# Patient Record
Sex: Female | Born: 1940 | Race: Black or African American | Hispanic: No | Smoking: Never smoker
Health system: Southern US, Community
[De-identification: ages and names within clinical notes are randomized; demographics above are authoritative.]

## PROBLEM LIST (undated history)

## (undated) DIAGNOSIS — E785 Hyperlipidemia, unspecified: Secondary | ICD-10-CM

## (undated) DIAGNOSIS — J302 Other seasonal allergic rhinitis: Secondary | ICD-10-CM

## (undated) DIAGNOSIS — I10 Essential (primary) hypertension: Secondary | ICD-10-CM

## (undated) DIAGNOSIS — M51369 Other intervertebral disc degeneration, lumbar region without mention of lumbar back pain or lower extremity pain: Secondary | ICD-10-CM

## (undated) DIAGNOSIS — E039 Hypothyroidism, unspecified: Secondary | ICD-10-CM

## (undated) DIAGNOSIS — L309 Dermatitis, unspecified: Secondary | ICD-10-CM

## (undated) DIAGNOSIS — T148XXA Other injury of unspecified body region, initial encounter: Secondary | ICD-10-CM

## (undated) DIAGNOSIS — H269 Unspecified cataract: Secondary | ICD-10-CM

## (undated) DIAGNOSIS — R42 Dizziness and giddiness: Secondary | ICD-10-CM

## (undated) DIAGNOSIS — M5136 Other intervertebral disc degeneration, lumbar region: Secondary | ICD-10-CM

## (undated) DIAGNOSIS — M8580 Other specified disorders of bone density and structure, unspecified site: Secondary | ICD-10-CM

## (undated) DIAGNOSIS — E042 Nontoxic multinodular goiter: Secondary | ICD-10-CM

## (undated) DIAGNOSIS — H409 Unspecified glaucoma: Secondary | ICD-10-CM

## (undated) HISTORY — PX: ABDOMINAL HYSTERECTOMY: SHX81

## (undated) HISTORY — PX: HAND SURGERY: SHX662

## (undated) HISTORY — PX: BREAST BIOPSY: SHX20

## (undated) HISTORY — PX: ANKLE SURGERY: SHX546

## (undated) HISTORY — PX: COLONOSCOPY: SHX174

## (undated) HISTORY — PX: OTHER SURGICAL HISTORY: SHX169

## (undated) HISTORY — PX: SHOULDER SURGERY: SHX246

---

## 2001-09-11 ENCOUNTER — Encounter: Admission: RE | Admit: 2001-09-11 | Discharge: 2001-09-11 | Payer: Self-pay | Admitting: Pediatrics

## 2001-09-23 ENCOUNTER — Encounter: Payer: Self-pay | Admitting: Orthopedic Surgery

## 2001-09-23 ENCOUNTER — Encounter: Admission: RE | Admit: 2001-09-23 | Discharge: 2001-09-23 | Payer: Self-pay | Admitting: Orthopedic Surgery

## 2001-11-28 ENCOUNTER — Ambulatory Visit (HOSPITAL_BASED_OUTPATIENT_CLINIC_OR_DEPARTMENT_OTHER): Admission: RE | Admit: 2001-11-28 | Discharge: 2001-11-28 | Payer: Self-pay | Admitting: Orthopedic Surgery

## 2004-07-02 ENCOUNTER — Ambulatory Visit (HOSPITAL_BASED_OUTPATIENT_CLINIC_OR_DEPARTMENT_OTHER): Admission: RE | Admit: 2004-07-02 | Discharge: 2004-07-02 | Payer: Self-pay | Admitting: Orthopedic Surgery

## 2004-07-02 ENCOUNTER — Ambulatory Visit (HOSPITAL_COMMUNITY): Admission: RE | Admit: 2004-07-02 | Discharge: 2004-07-02 | Payer: Self-pay | Admitting: Orthopedic Surgery

## 2006-01-05 ENCOUNTER — Ambulatory Visit (HOSPITAL_COMMUNITY): Admission: RE | Admit: 2006-01-05 | Discharge: 2006-01-05 | Payer: Self-pay | Admitting: Orthopedic Surgery

## 2006-01-05 ENCOUNTER — Encounter: Payer: Self-pay | Admitting: Vascular Surgery

## 2007-04-17 ENCOUNTER — Encounter: Admission: RE | Admit: 2007-04-17 | Discharge: 2007-04-17 | Payer: Self-pay | Admitting: Orthopedic Surgery

## 2010-05-11 ENCOUNTER — Encounter: Payer: Self-pay | Admitting: Orthopedic Surgery

## 2010-09-05 NOTE — Op Note (Signed)
NAMELAIKLYNN, RACZYNSKI              ACCOUNT NO.:  [id number]   MEDICAL RECORD NO.:  [id number]          PATIENT TYPE:  AMB   LOCATION:  DSC                          FACILITY:  [facility]   PHYSICIAN:  Cindee Salt, M.D.       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   OPERATION:  Decompression of radial and median nerves, right arm.   SURGEON:  Cindee Salt, M.D.   ASSISTANTCarolyne Fiscal.   ANESTHESIA:  General.   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   PROCEDURE:  The patient was brought to the operating room where a general  anesthetic was carried out without difficulty.  She was prepped and draped  using DuraPrep, supine position, right arm free.  The limb was exsanguinated  with an Esmarch bandage.  Tourniquet placed on the arm was inflated to 250  mmHg.  A straight incision was made longitudinally in the palm and down  through subcutaneous tissue.  Bleeders were cauterized.  Palmar fascia was  split, superficial palmar arch identified and the flexor tendon to the ring  and  little finger identified.  To the ulnar side of the median nerve, the  carpal retinaculum was incised with sharp dissection.  Right-angle and  Sewell retractors were placed between skin and forearm fascia.  The fascia  was released for approximately a centimeter and a half proximal to the wrist  crease under direct vision.  Canal was explored, tenosynovial tissue  moderately thickened, the area of deformity to the nerve apparent.  The  wound was irrigated and skin closed with  interrupted 5-0 nylon sutures.  A  separate incision was then made the antecubital fossa, carried out  proximally onto the distal upper arm, carried down through subcutaneous  tissue, the dissection carried down and the lateral antebrachial cutaneous  nerve of the forearm identified and protected, the dissection carried down  to the mobile wad of Sherilyn Cooter; this was elevated.  The radial nerve was  identified proximally exiting along the course of the brachialis and  brachioradialis.  The dissection was then carried distally.  Moderate and  tissue was present around this area; this was entirely debrided off.  The  nerve was found to be quite irritable.  The posterior interosseous nerve was  then identified; this was noted be compressed by a band of tissue in the  supinator at the arcade of [place]; this was released.  A retractor was  placed to identify the posterior interosseous and this was followed through  the supinator with no further compression.  No further lesions were  identified along the course of the radial nerve or the posterior  interosseous.  The  wound was irrigated and the subcutaneous tissue was  closed interrupted 4-0 Vicryl and skin with a subcuticular 4-0 Monocryl  suture.  Steri-Strips were applied and a sterile compressive long arm splint  applied.  The patient tolerated the procedure well and was taken to the recovery room  for observation in satisfactory condition.   She is discharged home to return to the [facility] in 1 week  on Vicodin.      GK/MEDQ  D:  07/02/2004  T:  07/02/2004  Job:  [id number]

## 2010-09-05 NOTE — Op Note (Signed)
Victoria Lyons, Victoria Lyons                          ACCOUNT NO.:  [id number]   MEDICAL RECORD NO.:  [id number]                    PATIENT TYPE:   LOCATION:                                       FACILITY:   PHYSICIAN:  Elana Alm. Thurston Hole, M.D.              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   POSTOPERATIVE DIAGNOSIS:  Left shoulder adhesive capsulitis with rotator  cuff tendonitis and impingement.   PROCEDURE:  1. Left shoulder EUA followed by manipulation.  2. Left shoulder arthroscopic lysis of adhesions with subacromial     decompression.   SURGEON:  Elana Alm. Thurston Hole, M.D.   ASSISTANT:  __________  Bethann Goo , [place].   ANESTHESIA:  General.   OPERATIVE TIME:  40 minutes.   COMPLICATIONS:  None.   INDICATIONS FOR PROCEDURE:  The patient is a 70 year old woman who has had  almost a year of left shoulder pain increasing in nature with signs and  symptoms and MRI documenting rotator cuff tendonitis with impingement in  adhesive capsulitis.  She has failed conservative care and is now to undergo  arthroscopy and manipulation.   DESCRIPTION:  The patient was brought to the operating room on 11/28/2001,  placed on the operative table in supine position.  After an adequate level  of general anesthesia was obtained, her left shoulder was examined under  anesthesia.  She had initial limitations of motion of forward flexion of  120, abduction of 100, internal rotation of 50, external rotation of 50  degrees.  A gentle manipulation was then carried out breaking up soft  adhesions and improving forward flexion to 175, abduction to 175, internal  and external rotation of 85% degrees.  The shoulder remained stable  ligamentous exam.  After this was done, she was placed on a beach-chair  position and her  shoulder and arm was prepped and draped using sterile  technique.  Originally the arthroscopy was performed  through a posterior  arthroscopic pull.  The arthroscope with a pump attached was placed into an  anterior portal and arthroscopic probe was placed.  On initial inspection  the articular cartilage in the glenohumeral joint was intact.  Anterior and  posterior labium intact.  Inferior labium and anterior inferior glenohumeral  ligament complex was intact.  Superior labium and biceps tendon and ankle  was intact.  The biceps tendon was intact.  The rotator cuff was thoroughly  inspected.  There was found to be no evidence of a tear.  Inferior capsule  recess showed mild hemorrhagic synovitis secondary to the adhesive  capsulitis and manipulation but no other pathology noted.  Subacromial space  was entered and a lateral arthroscopic portal was made.  Moderately thicken  bursitis was resected.  Subacromial decompression was carried out removing 6-  8 mm of the under surface of the anterolateral and anteromedial acromion and  CA ligament release carried out as well.  [facility] joint was not disturbed.  After this was done, the shoulder could be brought to a full range of motion  with no impingement on the rotator cuff which showed slight inflammation but  no evidence of tearing.  At this point, it was felt that all pathology had  been satisfactorily addressed.  The instruments were removed.  Portal was  closed with 3-0 nylon suture and injected with 0.25% Marcaine with  epinephrine.  Sterile dressings and a sling applied and the patient awakened  and taken to the recovery room in stable condition.   FOLLOW-UP CARE:  The patient will be followed as an outpatient on Darvocet  and Aleve.  Early, aggressive physical therapy.  See her back in the office  in a week for sutures out and followup.                                                Robert A. Thurston Hole, M.D.    RAW/MEDQ  D:   11/28/2001  T:  12/01/2001  Job:  [id number]

## 2015-02-14 ENCOUNTER — Other Ambulatory Visit: Payer: Self-pay | Admitting: Orthopedic Surgery

## 2015-02-14 DIAGNOSIS — M25512 Pain in left shoulder: Secondary | ICD-10-CM

## 2015-03-04 ENCOUNTER — Ambulatory Visit
Admission: RE | Admit: 2015-03-04 | Discharge: 2015-03-04 | Disposition: A | Discharge status: Home / Self Care | Payer: BC Managed Care – PPO | Source: Ambulatory Visit | Attending: Orthopedic Surgery | Admitting: Orthopedic Surgery

## 2015-03-04 DIAGNOSIS — M25512 Pain in left shoulder: Secondary | ICD-10-CM

## 2016-05-28 ENCOUNTER — Other Ambulatory Visit: Payer: Self-pay | Admitting: Physical Medicine and Rehabilitation

## 2016-05-28 DIAGNOSIS — M25551 Pain in right hip: Secondary | ICD-10-CM

## 2016-06-09 ENCOUNTER — Ambulatory Visit
Admission: RE | Admit: 2016-06-09 | Discharge: 2016-06-09 | Disposition: A | Discharge status: Home / Self Care | Payer: BC Managed Care – PPO | Source: Ambulatory Visit | Attending: Physical Medicine and Rehabilitation | Admitting: Physical Medicine and Rehabilitation

## 2016-06-09 DIAGNOSIS — M25551 Pain in right hip: Secondary | ICD-10-CM

## 2016-06-09 MED ORDER — METHYLPREDNISOLONE ACETATE 40 MG/ML INJ SUSP (RADIOLOG
120.0000 mg | Freq: Once | INTRAMUSCULAR | Status: AC
  Administered 2016-06-09: 120 mg via INTRA_ARTICULAR

## 2016-06-09 MED ORDER — IOPAMIDOL (ISOVUE-M 200) INJECTION 41%
15.0000 mL | Freq: Once | INTRAMUSCULAR | Status: AC
  Administered 2016-06-09: 15 mL via INTRA_ARTICULAR

## 2017-08-01 ENCOUNTER — Other Ambulatory Visit: Payer: Self-pay

## 2017-08-01 ENCOUNTER — Other Ambulatory Visit (HOSPITAL_BASED_OUTPATIENT_CLINIC_OR_DEPARTMENT_OTHER): Payer: Self-pay

## 2017-08-10 ENCOUNTER — Other Ambulatory Visit: Payer: Self-pay

## 2017-08-10 ENCOUNTER — Emergency Department (HOSPITAL_BASED_OUTPATIENT_CLINIC_OR_DEPARTMENT_OTHER)
Admission: EM | Admit: 2017-08-10 | Discharge: 2017-08-10 | Disposition: A | Discharge status: Home / Self Care | Payer: Medicare Other | Attending: Emergency Medicine | Admitting: Emergency Medicine

## 2017-08-10 ENCOUNTER — Encounter (HOSPITAL_BASED_OUTPATIENT_CLINIC_OR_DEPARTMENT_OTHER): Payer: Self-pay

## 2017-08-10 ENCOUNTER — Emergency Department (HOSPITAL_BASED_OUTPATIENT_CLINIC_OR_DEPARTMENT_OTHER): Payer: Medicare Other

## 2017-08-10 DIAGNOSIS — R000 Tachycardia, unspecified: Secondary | ICD-10-CM | POA: Insufficient documentation

## 2017-08-10 DIAGNOSIS — I10 Essential (primary) hypertension: Secondary | ICD-10-CM | POA: Insufficient documentation

## 2017-08-10 DIAGNOSIS — N3000 Acute cystitis without hematuria: Secondary | ICD-10-CM | POA: Diagnosis not present

## 2017-08-10 DIAGNOSIS — R509 Fever, unspecified: Secondary | ICD-10-CM

## 2017-08-10 HISTORY — DX: Essential (primary) hypertension: I10

## 2017-08-10 LAB — URINALYSIS, ROUTINE W REFLEX MICROSCOPIC
Bilirubin Urine: NEGATIVE
Glucose, UA: NEGATIVE mg/dL
KETONES UR: NEGATIVE mg/dL
Nitrite: POSITIVE — AB
PROTEIN: NEGATIVE mg/dL
Specific Gravity, Urine: 1.015 (ref 1.005–1.030)
pH: 6 (ref 5.0–8.0)

## 2017-08-10 LAB — HEPATIC FUNCTION PANEL
ALT: 14 U/L (ref 14–54)
AST: 19 U/L (ref 15–41)
Albumin: 4.1 g/dL (ref 3.5–5.0)
Alkaline Phosphatase: 85 U/L (ref 38–126)
BILIRUBIN INDIRECT: 0.4 mg/dL (ref 0.3–0.9)
Bilirubin, Direct: 0.1 mg/dL (ref 0.1–0.5)
TOTAL PROTEIN: 7.6 g/dL (ref 6.5–8.1)
Total Bilirubin: 0.5 mg/dL (ref 0.3–1.2)

## 2017-08-10 LAB — BASIC METABOLIC PANEL
Anion gap: 9 (ref 5–15)
BUN: 11 mg/dL (ref 6–20)
CALCIUM: 8.8 mg/dL — AB (ref 8.9–10.3)
CO2: 24 mmol/L (ref 22–32)
CREATININE: 0.89 mg/dL (ref 0.44–1.00)
Chloride: 105 mmol/L (ref 101–111)
GFR calc Af Amer: >60 mL/min (ref >60)
GFR calc non Af Amer: >60 mL/min (ref >60)
Glucose, Bld: 115 mg/dL — ABNORMAL HIGH (ref 65–99)
Potassium: 3.2 mmol/L — ABNORMAL LOW (ref 3.5–5.1)
SODIUM: 138 mmol/L (ref 135–145)

## 2017-08-10 LAB — CBC
HCT: 38.9 % (ref 36.0–46.0)
Hemoglobin: 13.3 g/dL (ref 12.0–15.0)
MCH: 27.8 pg (ref 26.0–34.0)
MCHC: 34.2 g/dL (ref 30.0–36.0)
MCV: 81.2 fL (ref 78.0–100.0)
Platelets: 278 10*3/uL (ref 150–400)
RBC: 4.79 MIL/uL (ref 3.87–5.11)
RDW: 15.4 % (ref 11.5–15.5)
WBC: 11 10*3/uL — AB (ref 4.0–10.5)

## 2017-08-10 LAB — I-STAT CG4 LACTIC ACID, ED
Lactic Acid, Venous: 1.07 mmol/L (ref 0.5–1.9)
Lactic Acid, Venous: 0.99 mmol/L (ref 0.5–1.9)

## 2017-08-10 LAB — DIFFERENTIAL
BASOS ABS: 0 10*3/uL (ref 0.0–0.1)
BASOS PCT: 0 %
Eosinophils Absolute: 0.1 10*3/uL (ref 0.0–0.7)
Eosinophils Relative: 1 %
Lymphocytes Relative: 30 %
Lymphs Abs: 3.3 10*3/uL (ref 0.7–4.0)
Monocytes Absolute: 1 10*3/uL (ref 0.1–1.0)
Monocytes Relative: 9 %
NEUTROS ABS: 6.6 10*3/uL (ref 1.7–7.7)
Neutrophils Relative %: 60 %

## 2017-08-10 LAB — URINALYSIS, MICROSCOPIC (REFLEX)

## 2017-08-10 LAB — TROPONIN I

## 2017-08-10 MED ORDER — CEPHALEXIN 500 MG PO CAPS: 500.0000 mg | ORAL_CAPSULE | Freq: Three times a day (TID) | ORAL | 0 refills | Status: DC

## 2017-08-10 MED ORDER — FLUCONAZOLE 150 MG PO TABS: 150.0000 mg | ORAL_TABLET | Freq: Once | ORAL | 0 refills | Status: AC

## 2017-08-10 MED ORDER — SODIUM CHLORIDE 0.9 % IV BOLUS
1000.0000 mL | Freq: Once | INTRAVENOUS | Status: AC
  Administered 2017-08-10: 1000 mL via INTRAVENOUS

## 2017-08-10 MED ORDER — ACETAMINOPHEN 500 MG PO TABS
1000.0000 mg | ORAL_TABLET | Freq: Once | ORAL | Status: AC
  Administered 2017-08-10: 1000 mg via ORAL
  Filled 2017-08-10: qty 2

## 2017-08-10 MED ORDER — SODIUM CHLORIDE 0.9 % IV SOLN
1.0000 g | Freq: Once | INTRAVENOUS | Status: AC
  Administered 2017-08-10: 1 g via INTRAVENOUS
  Filled 2017-08-10: qty 10

## 2017-08-10 NOTE — ED Notes (Signed)
Lactic results given to Dr Elesa MassedWard.

## 2017-08-10 NOTE — ED Provider Notes (Signed)
TIME SEEN: 2:23 AM  CHIEF COMPLAINT: Tachycardia  HPI: Patient is a 77 year old female with history of hypertension who presents to the emergency department with tachycardia.  She states tonight she felt like her heart was going fast and she checked her vitals at home and her blood pressure was in the 150s/90s and her heart rate was 111.  She states that she has not had any chest pain or shortness of breath.  She is otherwise been feeling well.  No known fevers.  No vomiting or diarrhea.  No dysuria or hematuria.  No cough.  ROS: See HPI Constitutional: no fever  Eyes: no drainage  ENT: no runny nose   Cardiovascular:  no chest pain  Resp: no SOB  GI: no vomiting GU: no dysuria Integumentary: no rash  Allergy: no hives  Musculoskeletal: no leg swelling  Neurological: no slurred speech ROS otherwise negative  PAST MEDICAL HISTORY/PAST SURGICAL HISTORY:  Past Medical History:  Diagnosis Date  . Hypertension     MEDICATIONS:  Prior to Admission medications   Not on File    ALLERGIES:  Allergies  Allergen Reactions  . Tetanus Toxoids Rash  . Tetracyclines & Related Rash    SOCIAL HISTORY:  Social History   Tobacco Use  . Smoking status: Never Smoker  . Smokeless tobacco: Never Used  Substance Use Topics  . Alcohol use: Never    Frequency: Never    FAMILY HISTORY: History reviewed. No pertinent family history.  EXAM: BP (!) 165/86 (BP Location: Left Arm)   Pulse (!) 122   Temp 100 F (37.8 C) (Oral)   Resp 16   Ht 5\' 6"  (1.676 m)   Wt 81.4 kg (179 lb 7.3 oz)   SpO2 98%   BMI 28.96 kg/m  CONSTITUTIONAL: Alert and oriented and responds appropriately to questions. Well-appearing; well-nourished HEAD: Normocephalic EYES: Conjunctivae clear, pupils appear equal, EOMI ENT: normal nose; moist mucous membranes NECK: Supple, no meningismus, no nuchal rigidity, no LAD  CARD: RRR; S1 and S2 appreciated; no murmurs, no clicks, no rubs, no gallops RESP: Normal chest  excursion without splinting or tachypnea; breath sounds clear and equal bilaterally; no wheezes, no rhonchi, no rales, no hypoxia or respiratory distress, speaking full sentences ABD/GI: Normal bowel sounds; non-distended; soft, non-tender, no rebound, no guarding, no peritoneal signs, no hepatosplenomegaly BACK:  The back appears normal and is non-tender to palpation, there is no CVA tenderness EXT: Normal ROM in all joints; non-tender to palpation; no edema; normal capillary refill; no cyanosis, no calf tenderness or swelling    SKIN: Normal color for age and race; warm; no rash NEURO: Moves all extremities equally PSYCH: The patient's mood and manner are appropriate. Grooming and personal hygiene are appropriate.  MEDICAL DECISION MAKING: Patient here with tachycardia.  Likely because she has a low-grade temperature of 100.  She is otherwise extremely well-appearing without complaints.  Heart rate has improved into the 80s after Tylenol.  She does not appear septic.  Normal blood pressure normal lactate.  She does have a leukocytosis of 11,000.  Chest x-ray is clear.  Troponin is negative.  Electrolytes normal.  She does appear to have a urinary tract infection which is likely the cause of her symptoms today.  Given she is so well-appearing I feel it is reasonable to let her go home and patient and son are comfortable with this plan.  Given dose of IV ceftriaxone and IV fluids here in the emergency department.  She will follow-up with  her primary care physician in 1 week for recheck.  Will discharge with Keflex.  Also given prescription for Diflucan as she is concerned she may develop yeast infection after antibiotics.  Discussed at length return precautions.  Patient and family comfortable with this plan.  Doubt pneumonia.  Doubt meningitis.  Doubt bacteremia.  Doubt ACS, PE or dissection.  At this time, I do not feel there is any life-threatening condition present. I have reviewed and discussed all  results (EKG, imaging, lab, urine as appropriate) and exam findings with patient/family. I have reviewed nursing notes and appropriate previous records.  I feel the patient is safe to be discharged home without further emergent workup and can continue workup as an outpatient as needed. Discussed usual and customary return precautions. Patient/family verbalize understanding and are comfortable with this plan.  Outpatient follow-up has been provided if needed. All questions have been answered.     EKG Interpretation  Date/Time:  Tuesday August 10 2017 02:02:05 EDT Ventricular Rate:  97 PR Interval:    QRS Duration: 72 QT Interval:  425 QTC Calculation: 540 R Axis:   -58 Text Interpretation:  Sinus rhythm Probable left atrial enlargement Inferior infarct, old Consider anterior infarct Prolonged QT interval Confirmed by Rochele Raring [phone]) on 08/10/2017 2:24:23 AM          Ward, Layla Maw, DO 08/10/17 [id number]

## 2017-08-10 NOTE — ED Triage Notes (Signed)
Pt reports feeling like her heart is racing. Pts HR in triage 120s. Pt denies cardiac hx. Pt denies taking any stimulants this PM. Pt denies cp and sob. Pt A+OX4, speaking in complete sentences.

## 2017-08-10 NOTE — Discharge Instructions (Signed)
You may take Tylenol 1000 mg every 6 hours as needed for fever and pain.  Please follow-up with your primary care physician in 1 week for recheck.

## 2017-08-10 NOTE — ED Notes (Signed)
Lactic results given to Dr Ward. 

## 2017-08-10 NOTE — ED Notes (Signed)
Patient transported to X-ray 

## 2017-08-12 LAB — URINE CULTURE: Culture: >=100000 — AB

## 2017-08-13 ENCOUNTER — Telehealth: Payer: Self-pay

## 2017-08-13 NOTE — Telephone Encounter (Signed)
Post ED Visit - Positive Culture Follow-up  Culture report reviewed by antimicrobial stewardship pharmacist:  []  Enzo BiNathan Batchelder, Pharm.D. []  Celedonio MiyamotoJeremy Frens, Pharm.D., BCPS AQ-ID []  Garvin FilaMike Maccia, Pharm.D., BCPS [x]  Georgina PillionElizabeth Martin, Pharm.D., BCPS []  LakesideMinh Pham, Vermont[place]arm.D., BCPS, AAHIVP []  Estella HuskMichelle Turner, Pharm.D., BCPS, AAHIVP []  Lysle Pearlachel Rumbarger, PharmD, BCPS []  Blake DivineShannon Parkey, PharmD []  Pollyann SamplesAndy Johnston, PharmD, BCPS  Positive urine culture Treated with Cephalexin, organism sensitive to the same and no further patient follow-up is required at this time.  Jerry CarasCullom, Jadelin Eng Burnett 08/13/2017, 9:24 AM

## 2019-09-15 ENCOUNTER — Encounter (HOSPITAL_COMMUNITY): Payer: Self-pay

## 2019-09-15 NOTE — Patient Instructions (Addendum)
DUE TO COVID-19 ONLY ONE VISITOR ARE ALLOWED TO COME WITH YOU AND STAY IN THE WAITING ROOM ONLY DURING PRE OP AND PROCEDURE. THEN TWO VISITORS MAY VISIT WITH YOU IN YOUR PRIVATE ROOM DURING VISITING HOURS ONLY!!   COVID SWAB TESTING MUST BE COMPLETED ON:  Friday , September 22, 2019  11:15AM  [address], [place] -[facility] enter pre surgical testing line (Must self quarantine after testing. Follow instructions on handout.)             Your procedure is scheduled on: Tuesday, September 26, 2019   Report to [facility] Main  Entrance    Report to admitting at 8:15 AM   Call this number if you have problems the morning of surgery [phone]   Do not eat food :After Midnight.   May have liquids until 7:45 AM day of surgery   CLEAR LIQUID DIET  Foods Allowed                                                                     Foods Excluded  Water, Black Coffee and tea, regular and decaf                             liquids that you cannot  Plain Jell-O in any flavor  (No red)                                           see through such as: Fruit ices (not with fruit pulp)                                     milk, soups, orange juice  Iced Popsicles (No red)                                    All solid food                                    Apple juices Sports drinks like Gatorade (No red) Lightly seasoned clear broth or consume(fat free) Sugar, honey syrup  Sample Menu Breakfast                                Lunch                                     Supper Cranberry juice                    Beef broth                            Chicken broth Jell-O  Grape juice                           Apple juice Coffee or tea                        Jell-O                                      Popsicle                                                Coffee or tea                        Coffee or tea   Complete one Ensure drink the  morning of surgery at 7:45 AM  the day of surgery.   Oral Hygiene is also important to reduce your risk of infection.                                    Remember - BRUSH YOUR TEETH THE MORNING OF SURGERY WITH YOUR REGULAR TOOTHPASTE   Do NOT smoke after Midnight   Take these medicines the morning of surgery with A SIP OF WATER: Amlodipine, Loratadine if needed   May use Flonase and eye drops if needed day of surgery                               You may not have any metal on your body including hair pins, jewelry, and body piercings             Do not wear make-up, lotions, powders, perfumes/cologne, or deodorant             Do not wear nail polish.  Do not shave  48 hours prior to surgery.               Do not bring valuables to the hospital. [facility] IS NOT             RESPONSIBLE   FOR VALUABLES.   Contacts, dentures or bridgework may not be worn into surgery.   Bring small overnight bag day of surgery.    Patients discharged the day of surgery will not be allowed to drive home.   Special Instructions: Bring a copy of your healthcare power of attorney and living will documents         the day of surgery if you haven't scanned them in before.              Please read over the following fact sheets you were given: IF YOU HAVE QUESTIONS ABOUT YOUR PRE OP INSTRUCTIONS PLEASE CALL [phone]   [facility] - Preparing for Surgery Before surgery, you can play an important role.  Because skin is not sterile, your skin needs to be as free of germs as possible.  You can reduce the number of germs on your skin by washing with CHG (chlorahexidine gluconate) soap before surgery.  CHG is an antiseptic cleaner which kills germs and bonds with the skin  to continue killing germs even after washing. Please DO NOT use if you have an allergy to CHG or antibacterial soaps.  If your skin becomes reddened/irritated stop using the CHG and inform your nurse when you arrive at Short Stay. Do not shave  (including legs and underarms) for at least 48 hours prior to the first CHG shower.  You may shave your face/neck.  Please follow these instructions carefully:  1.  Shower with CHG Soap the night before surgery and the  morning of surgery.  2.  If you choose to wash your hair, wash your hair first as usual with your normal  shampoo.  3.  After you shampoo, rinse your hair and body thoroughly to remove the shampoo.                             4.  Use CHG as you would any other liquid soap.  You can apply chg directly to the skin and wash.  Gently with a scrungie or clean washcloth.  5.  Apply the CHG Soap to your body ONLY FROM THE NECK DOWN.   Do   not use on face/ open                           Wound or open sores. Avoid contact with eyes, ears mouth and   genitals (private parts).                       Wash face,  Genitals (private parts) with your normal soap.             6.  Wash thoroughly, paying special attention to the area where your    surgery  will be performed.  7.  Thoroughly rinse your body with warm water from the neck down.  8.  DO NOT shower/wash with your normal soap after using and rinsing off the CHG Soap.                9.  Pat yourself dry with a clean towel.            10.  Wear clean pajamas.            11.  Place clean sheets on your bed the night of your first shower and do not  sleep with pets. Day of Surgery : Do not apply any lotions/deodorants the morning of surgery.  Please wear clean clothes to the hospital/surgery center.  FAILURE TO FOLLOW THESE INSTRUCTIONS MAY RESULT IN THE CANCELLATION OF YOUR SURGERY  PATIENT SIGNATURE_________________________________  NURSE SIGNATURE__________________________________  ________________________________________________________________________   Rogelia Mire  An incentive spirometer is a tool that can help keep your lungs clear and active. This tool measures how well you are filling your lungs with each breath.  Taking long deep breaths may help reverse or decrease the chance of developing breathing (pulmonary) problems (especially infection) following:  A long period of time when you are unable to move or be active. BEFORE THE PROCEDURE   If the spirometer includes an indicator to show your best effort, your nurse or respiratory therapist will set it to a desired goal.  If possible, sit up straight or lean slightly forward. Try not to slouch.  Hold the incentive spirometer in an upright position. INSTRUCTIONS FOR USE  1. Sit on the edge of your bed if possible, or sit up as  far as you can in bed or on a chair. 2. Hold the incentive spirometer in an upright position. 3. Breathe out normally. 4. Place the mouthpiece in your mouth and seal your lips tightly around it. 5. Breathe in slowly and as deeply as possible, raising the piston or the ball toward the top of the column. 6. Hold your breath for 3-5 seconds or for as long as possible. Allow the piston or ball to fall to the bottom of the column. 7. Remove the mouthpiece from your mouth and breathe out normally. 8. Rest for a few seconds and repeat Steps 1 through 7 at least 10 times every 1-2 hours when you are awake. Take your time and take a few normal breaths between deep breaths. 9. The spirometer may include an indicator to show your best effort. Use the indicator as a goal to work toward during each repetition. 10. After each set of 10 deep breaths, practice coughing to be sure your lungs are clear. If you have an incision (the cut made at the time of surgery), support your incision when coughing by placing a pillow or rolled up towels firmly against it. Once you are able to get out of bed, walk around indoors and cough well. You may stop using the incentive spirometer when instructed by your caregiver.  RISKS AND COMPLICATIONS  Take your time so you do not get dizzy or light-headed.  If you are in pain, you may need to take or ask for pain  medication before doing incentive spirometry. It is harder to take a deep breath if you are having pain. AFTER USE  Rest and breathe slowly and easily.  It can be helpful to keep track of a log of your progress. Your caregiver can provide you with a simple table to help with this. If you are using the spirometer at home, follow these instructions: SEEK MEDICAL CARE IF:   You are having difficultly using the spirometer.  You have trouble using the spirometer as often as instructed.  Your pain medication is not giving enough relief while using the spirometer.  You develop fever of 100.5 F (38.1 C) or higher. SEEK IMMEDIATE MEDICAL CARE IF:   You cough up bloody sputum that had not been present before.  You develop fever of 102 F (38.9 C) or greater.  You develop worsening pain at or near the incision site. MAKE SURE YOU:   Understand these instructions.  Will watch your condition.  Will get help right away if you are not doing well or get worse. Document Released: 08/17/2006 Document Revised: 06/29/2011 Document Reviewed: 10/18/2006 [facility] Patient Information 2014 [place], [place].   ________________________________________________________________________

## 2019-09-19 ENCOUNTER — Encounter (HOSPITAL_COMMUNITY)
Admission: RE | Admit: 2019-09-19 | Discharge: 2019-09-19 | Disposition: A | Discharge status: Home / Self Care | Payer: Medicare PPO | Source: Ambulatory Visit | Attending: Orthopedic Surgery | Admitting: Orthopedic Surgery

## 2019-09-19 ENCOUNTER — Other Ambulatory Visit: Payer: Self-pay

## 2019-09-19 ENCOUNTER — Encounter (HOSPITAL_COMMUNITY): Payer: Self-pay

## 2019-09-19 DIAGNOSIS — I444 Left anterior fascicular block: Secondary | ICD-10-CM | POA: Diagnosis not present

## 2019-09-19 DIAGNOSIS — Z01818 Encounter for other preprocedural examination: Secondary | ICD-10-CM | POA: Insufficient documentation

## 2019-09-19 HISTORY — DX: Other injury of unspecified body region, initial encounter: T14.8XXA

## 2019-09-19 HISTORY — DX: Other intervertebral disc degeneration, lumbar region: M51.36

## 2019-09-19 HISTORY — DX: Dermatitis, unspecified: L30.9

## 2019-09-19 HISTORY — DX: Unspecified glaucoma: H40.9

## 2019-09-19 HISTORY — DX: Other intervertebral disc degeneration, lumbar region without mention of lumbar back pain or lower extremity pain: M51.369

## 2019-09-19 HISTORY — DX: Hypothyroidism, unspecified: E03.9

## 2019-09-19 HISTORY — DX: Hyperlipidemia, unspecified: E78.5

## 2019-09-19 HISTORY — DX: Nontoxic multinodular goiter: E04.2

## 2019-09-19 HISTORY — DX: Other specified disorders of bone density and structure, unspecified site: M85.80

## 2019-09-19 HISTORY — DX: Dizziness and giddiness: R42

## 2019-09-19 HISTORY — DX: Unspecified cataract: H26.9

## 2019-09-19 HISTORY — DX: Other seasonal allergic rhinitis: J30.2

## 2019-09-19 LAB — BASIC METABOLIC PANEL
Anion gap: 8 (ref 5–15)
BUN: 20 mg/dL (ref 8–23)
CO2: 26 mmol/L (ref 22–32)
Calcium: 9.3 mg/dL (ref 8.9–10.3)
Chloride: 109 mmol/L (ref 98–111)
Creatinine, Ser: 1.12 mg/dL — ABNORMAL HIGH (ref 0.44–1.00)
GFR calc Af Amer: 55 mL/min — ABNORMAL LOW (ref >60)
GFR calc non Af Amer: 47 mL/min — ABNORMAL LOW (ref >60)
Glucose, Bld: 103 mg/dL — ABNORMAL HIGH (ref 70–99)
Potassium: 3.8 mmol/L (ref 3.5–5.1)
Sodium: 143 mmol/L (ref 135–145)

## 2019-09-19 LAB — SURGICAL PCR SCREEN
MRSA, PCR: NEGATIVE
Staphylococcus aureus: NEGATIVE

## 2019-09-19 LAB — CBC
HCT: 43.5 % (ref 36.0–46.0)
Hemoglobin: 13.6 g/dL (ref 12.0–15.0)
MCH: 27.4 pg (ref 26.0–34.0)
MCHC: 31.3 g/dL (ref 30.0–36.0)
MCV: 87.5 fL (ref 80.0–100.0)
Platelets: 284 10*3/uL (ref 150–400)
RBC: 4.97 MIL/uL (ref 3.87–5.11)
RDW: 14.6 % (ref 11.5–15.5)
WBC: 5.8 10*3/uL (ref 4.0–10.5)
nRBC: 0 % (ref 0.0–0.2)

## 2019-09-19 NOTE — Progress Notes (Signed)
COVID Vaccine Completed: Yes Date COVID Vaccine completed: 08/08/19 COVID vaccine manufacturer: Moderna   PCP - M. Webb P.A. Cardiologist - N/A  Chest x-ray - greater than 1 year EKG - 6/1/20201 in epic Stress Test - N/A ECHO - N/A Cardiac Cath - N/A  Sleep Study - N/A CPAP - N/A  Fasting Blood Sugar - N/A Checks Blood Sugar __N/A___ times a day  Blood Thinner Instructions: N/A Aspirin Instructions: N/A Last Dose: N/A  Anesthesia review: N/A  Patient denies shortness of breath, fever, cough and chest pain at PAT appointment   Patient verbalized understanding of instructions that were given to them at the PAT appointment. Patient was also instructed that they will need to review over the PAT instructions again at home before surgery.

## 2019-09-22 ENCOUNTER — Other Ambulatory Visit (HOSPITAL_COMMUNITY)
Admission: RE | Admit: 2019-09-22 | Discharge: 2019-09-22 | Disposition: A | Discharge status: Home / Self Care | Payer: Medicare PPO | Source: Ambulatory Visit | Attending: Orthopedic Surgery | Admitting: Orthopedic Surgery

## 2019-09-22 DIAGNOSIS — Z20822 Contact with and (suspected) exposure to covid-19: Secondary | ICD-10-CM | POA: Diagnosis not present

## 2019-09-22 DIAGNOSIS — Z01812 Encounter for preprocedural laboratory examination: Secondary | ICD-10-CM | POA: Insufficient documentation

## 2019-09-23 LAB — SARS CORONAVIRUS 2 (TAT 6-24 HRS): SARS Coronavirus 2: NEGATIVE

## 2019-09-26 ENCOUNTER — Observation Stay (HOSPITAL_COMMUNITY): Payer: Medicare PPO

## 2019-09-26 ENCOUNTER — Encounter (HOSPITAL_COMMUNITY)
Admission: RE | Disposition: A | Discharge status: Home / Self Care | Payer: Self-pay | Source: Home / Self Care | Attending: Orthopedic Surgery

## 2019-09-26 ENCOUNTER — Encounter (HOSPITAL_COMMUNITY): Payer: Self-pay | Admitting: Orthopedic Surgery

## 2019-09-26 ENCOUNTER — Observation Stay (HOSPITAL_COMMUNITY)
Admission: RE | Admit: 2019-09-26 | Discharge: 2019-09-27 | Disposition: A | Discharge status: Home / Self Care | Payer: Medicare PPO | Attending: Orthopedic Surgery | Admitting: Orthopedic Surgery

## 2019-09-26 ENCOUNTER — Other Ambulatory Visit: Payer: Self-pay

## 2019-09-26 ENCOUNTER — Ambulatory Visit (HOSPITAL_COMMUNITY): Payer: Medicare PPO | Admitting: Anesthesiology

## 2019-09-26 DIAGNOSIS — E1136 Type 2 diabetes mellitus with diabetic cataract: Secondary | ICD-10-CM | POA: Insufficient documentation

## 2019-09-26 DIAGNOSIS — I10 Essential (primary) hypertension: Secondary | ICD-10-CM | POA: Diagnosis not present

## 2019-09-26 DIAGNOSIS — Z91040 Latex allergy status: Secondary | ICD-10-CM | POA: Insufficient documentation

## 2019-09-26 DIAGNOSIS — E039 Hypothyroidism, unspecified: Secondary | ICD-10-CM | POA: Diagnosis not present

## 2019-09-26 DIAGNOSIS — Z887 Allergy status to serum and vaccine status: Secondary | ICD-10-CM | POA: Diagnosis not present

## 2019-09-26 DIAGNOSIS — M8580 Other specified disorders of bone density and structure, unspecified site: Secondary | ICD-10-CM | POA: Insufficient documentation

## 2019-09-26 DIAGNOSIS — Z881 Allergy status to other antibiotic agents status: Secondary | ICD-10-CM | POA: Insufficient documentation

## 2019-09-26 DIAGNOSIS — Q6589 Other specified congenital deformities of hip: Secondary | ICD-10-CM | POA: Insufficient documentation

## 2019-09-26 DIAGNOSIS — E785 Hyperlipidemia, unspecified: Secondary | ICD-10-CM | POA: Insufficient documentation

## 2019-09-26 DIAGNOSIS — Z79899 Other long term (current) drug therapy: Secondary | ICD-10-CM | POA: Insufficient documentation

## 2019-09-26 DIAGNOSIS — M1611 Unilateral primary osteoarthritis, right hip: Principal | ICD-10-CM | POA: Insufficient documentation

## 2019-09-26 DIAGNOSIS — Z96641 Presence of right artificial hip joint: Secondary | ICD-10-CM

## 2019-09-26 HISTORY — PX: TOTAL HIP ARTHROPLASTY: SHX124

## 2019-09-26 SURGERY — ARTHROPLASTY, HIP, TOTAL,POSTERIOR APPROACH
Anesthesia: Spinal | Site: Hip | Laterality: Right

## 2019-09-26 MED ORDER — ONDANSETRON HCL 4 MG/2ML IJ SOLN: 4.0000 mg | Freq: Once | INTRAMUSCULAR | Status: DC | PRN

## 2019-09-26 MED ORDER — BUPIVACAINE HCL (PF) 0.25 % IJ SOLN
INTRAMUSCULAR | Status: AC
  Filled 2019-09-26: qty 30

## 2019-09-26 MED ORDER — KETOROLAC TROMETHAMINE 30 MG/ML IJ SOLN
INTRAMUSCULAR | Status: DC | PRN
  Administered 2019-09-26: 30 mg

## 2019-09-26 MED ORDER — HYDROMORPHONE HCL 1 MG/ML IJ SOLN
INTRAMUSCULAR | Status: AC
  Filled 2019-09-26: qty 1

## 2019-09-26 MED ORDER — METOCLOPRAMIDE HCL 5 MG/ML IJ SOLN: 5.0000 mg | Freq: Three times a day (TID) | INTRAMUSCULAR | Status: DC | PRN

## 2019-09-26 MED ORDER — ONDANSETRON HCL 4 MG PO TABS: 4.0000 mg | ORAL_TABLET | Freq: Three times a day (TID) | ORAL | 0 refills | Status: DC | PRN

## 2019-09-26 MED ORDER — DOCUSATE SODIUM 100 MG PO CAPS
100.0000 mg | ORAL_CAPSULE | Freq: Two times a day (BID) | ORAL | Status: DC
  Administered 2019-09-27: 100 mg via ORAL
  Filled 2019-09-26 (×2): qty 1

## 2019-09-26 MED ORDER — ONDANSETRON HCL 4 MG PO TABS: 4.0000 mg | ORAL_TABLET | Freq: Four times a day (QID) | ORAL | Status: DC | PRN

## 2019-09-26 MED ORDER — HYDROCODONE-ACETAMINOPHEN 5-325 MG PO TABS: 1.0000 | ORAL_TABLET | ORAL | Status: DC | PRN

## 2019-09-26 MED ORDER — MAGNESIUM CITRATE PO SOLN: 1.0000 | Freq: Once | ORAL | Status: DC | PRN

## 2019-09-26 MED ORDER — PHENOL 1.4 % MT LIQD: 1.0000 | OROMUCOSAL | Status: DC | PRN

## 2019-09-26 MED ORDER — KETOROLAC TROMETHAMINE 15 MG/ML IJ SOLN
7.5000 mg | Freq: Four times a day (QID) | INTRAMUSCULAR | Status: AC
  Administered 2019-09-26 – 2019-09-27 (×4): 7.5 mg via INTRAVENOUS
  Filled 2019-09-26 (×4): qty 1

## 2019-09-26 MED ORDER — BACLOFEN 10 MG PO TABS
10.0000 mg | ORAL_TABLET | Freq: Three times a day (TID) | ORAL | 0 refills | Status: DC
Start: 2019-09-26 — End: 2021-05-21

## 2019-09-26 MED ORDER — DEXAMETHASONE SODIUM PHOSPHATE 10 MG/ML IJ SOLN
INTRAMUSCULAR | Status: DC | PRN
  Administered 2019-09-26: 10 mg via INTRAVENOUS

## 2019-09-26 MED ORDER — MEPERIDINE HCL 50 MG/ML IJ SOLN: 6.2500 mg | INTRAMUSCULAR | Status: DC | PRN

## 2019-09-26 MED ORDER — ONDANSETRON HCL 4 MG/2ML IJ SOLN
INTRAMUSCULAR | Status: DC | PRN
Start: 2019-09-26 — End: 2019-09-26
  Administered 2019-09-26: 4 mg via INTRAVENOUS

## 2019-09-26 MED ORDER — BUPIVACAINE HCL (PF) 0.25 % IJ SOLN
INTRAMUSCULAR | Status: DC | PRN
  Administered 2019-09-26: 30 mL

## 2019-09-26 MED ORDER — POVIDONE-IODINE 10 % EX SWAB
2.0000 "application " | Freq: Once | CUTANEOUS | Status: AC
  Administered 2019-09-26: 2 via TOPICAL

## 2019-09-26 MED ORDER — MIDAZOLAM HCL 2 MG/2ML IJ SOLN
INTRAMUSCULAR | Status: AC
  Filled 2019-09-26: qty 2

## 2019-09-26 MED ORDER — ACETAMINOPHEN 500 MG PO TABS
1000.0000 mg | ORAL_TABLET | Freq: Once | ORAL | Status: AC
  Administered 2019-09-26: 1000 mg via ORAL
  Filled 2019-09-26: qty 2

## 2019-09-26 MED ORDER — DIPHENHYDRAMINE HCL 12.5 MG/5ML PO ELIX: 12.5000 mg | ORAL_SOLUTION | ORAL | Status: DC | PRN

## 2019-09-26 MED ORDER — BISACODYL 10 MG RE SUPP: 10.0000 mg | Freq: Every day | RECTAL | Status: DC | PRN

## 2019-09-26 MED ORDER — ASPIRIN EC 325 MG PO TBEC
325.0000 mg | DELAYED_RELEASE_TABLET | Freq: Two times a day (BID) | ORAL | 0 refills | Status: DC
Start: 2019-09-26 — End: 2021-05-21

## 2019-09-26 MED ORDER — FENTANYL CITRATE (PF) 100 MCG/2ML IJ SOLN
INTRAMUSCULAR | Status: DC | PRN
  Administered 2019-09-26: 50 ug via INTRAVENOUS

## 2019-09-26 MED ORDER — HYDROCODONE-ACETAMINOPHEN 5-325 MG PO TABS: 1.0000 | ORAL_TABLET | Freq: Four times a day (QID) | ORAL | 0 refills | Status: DC | PRN

## 2019-09-26 MED ORDER — HYDROMORPHONE HCL 1 MG/ML IJ SOLN
0.2500 mg | INTRAMUSCULAR | Status: DC | PRN
  Administered 2019-09-26 (×2): 0.25 mg via INTRAVENOUS

## 2019-09-26 MED ORDER — PHENYLEPHRINE HCL-NACL 20-0.9 MG/250ML-% IV SOLN
INTRAVENOUS | Status: DC | PRN
  Administered 2019-09-26: 50 ug/min via INTRAVENOUS

## 2019-09-26 MED ORDER — METOCLOPRAMIDE HCL 5 MG PO TABS: 5.0000 mg | ORAL_TABLET | Freq: Three times a day (TID) | ORAL | Status: DC | PRN

## 2019-09-26 MED ORDER — 0.9 % SODIUM CHLORIDE (POUR BTL) OPTIME
TOPICAL | Status: DC | PRN
  Administered 2019-09-26: 1000 mL

## 2019-09-26 MED ORDER — ACETAMINOPHEN 325 MG PO TABS: 325.0000 mg | ORAL_TABLET | Freq: Four times a day (QID) | ORAL | Status: DC | PRN

## 2019-09-26 MED ORDER — CEFAZOLIN SODIUM-DEXTROSE 2-4 GM/100ML-% IV SOLN
2.0000 g | INTRAVENOUS | Status: AC
  Administered 2019-09-26: 2 g via INTRAVENOUS
  Filled 2019-09-26: qty 100

## 2019-09-26 MED ORDER — POTASSIUM CHLORIDE IN NACL 20-0.45 MEQ/L-% IV SOLN
INTRAVENOUS | Status: DC
  Filled 2019-09-26 (×2): qty 1000

## 2019-09-26 MED ORDER — ASPIRIN EC 325 MG PO TBEC
325.0000 mg | DELAYED_RELEASE_TABLET | Freq: Every day | ORAL | Status: DC
  Administered 2019-09-27: 325 mg via ORAL
  Filled 2019-09-26: qty 1

## 2019-09-26 MED ORDER — OLOPATADINE HCL 0.1 % OP SOLN
1.0000 [drp] | Freq: Two times a day (BID) | OPHTHALMIC | Status: DC | PRN
  Filled 2019-09-26: qty 5

## 2019-09-26 MED ORDER — ACETAMINOPHEN 500 MG PO TABS
500.0000 mg | ORAL_TABLET | Freq: Four times a day (QID) | ORAL | Status: DC
  Administered 2019-09-26 – 2019-09-27 (×3): 500 mg via ORAL
  Filled 2019-09-26 (×3): qty 1

## 2019-09-26 MED ORDER — ACETAMINOPHEN 500 MG PO TABS
ORAL_TABLET | ORAL | Status: AC
  Filled 2019-09-26: qty 1

## 2019-09-26 MED ORDER — CHLORHEXIDINE GLUCONATE 0.12 % MT SOLN
15.0000 mL | Freq: Once | OROMUCOSAL | Status: AC
  Administered 2019-09-26: 15 mL via OROMUCOSAL

## 2019-09-26 MED ORDER — FENTANYL CITRATE (PF) 100 MCG/2ML IJ SOLN
INTRAMUSCULAR | Status: AC
  Filled 2019-09-26: qty 2

## 2019-09-26 MED ORDER — AMLODIPINE BESYLATE 5 MG PO TABS
5.0000 mg | ORAL_TABLET | Freq: Every day | ORAL | Status: DC
  Administered 2019-09-27: 5 mg via ORAL
  Filled 2019-09-26: qty 1

## 2019-09-26 MED ORDER — ORAL CARE MOUTH RINSE: 15.0000 mL | Freq: Once | OROMUCOSAL | Status: AC

## 2019-09-26 MED ORDER — ONDANSETRON HCL 4 MG/2ML IJ SOLN: 4.0000 mg | Freq: Four times a day (QID) | INTRAMUSCULAR | Status: DC | PRN

## 2019-09-26 MED ORDER — DEXAMETHASONE SODIUM PHOSPHATE 10 MG/ML IJ SOLN
10.0000 mg | Freq: Once | INTRAMUSCULAR | Status: AC
  Administered 2019-09-27: 10 mg via INTRAVENOUS
  Filled 2019-09-26: qty 1

## 2019-09-26 MED ORDER — KETOROLAC TROMETHAMINE 30 MG/ML IJ SOLN
INTRAMUSCULAR | Status: AC
  Filled 2019-09-26: qty 1

## 2019-09-26 MED ORDER — CEFAZOLIN SODIUM-DEXTROSE 2-4 GM/100ML-% IV SOLN
2.0000 g | Freq: Four times a day (QID) | INTRAVENOUS | Status: AC
  Administered 2019-09-26 – 2019-09-27 (×2): 2 g via INTRAVENOUS
  Filled 2019-09-26 (×2): qty 100

## 2019-09-26 MED ORDER — LACTATED RINGERS IV SOLN: INTRAVENOUS | Status: DC

## 2019-09-26 MED ORDER — TRANEXAMIC ACID-NACL 1000-0.7 MG/100ML-% IV SOLN
1000.0000 mg | INTRAVENOUS | Status: AC
  Administered 2019-09-26: 1000 mg via INTRAVENOUS
  Filled 2019-09-26: qty 100

## 2019-09-26 MED ORDER — MIDAZOLAM HCL 5 MG/5ML IJ SOLN
INTRAMUSCULAR | Status: DC | PRN
  Administered 2019-09-26: 1 mg via INTRAVENOUS

## 2019-09-26 MED ORDER — MENTHOL 3 MG MT LOZG: 1.0000 | LOZENGE | OROMUCOSAL | Status: DC | PRN

## 2019-09-26 MED ORDER — SENNA-DOCUSATE SODIUM 8.6-50 MG PO TABS: 2.0000 | ORAL_TABLET | Freq: Every day | ORAL | 1 refills | Status: DC

## 2019-09-26 MED ORDER — LORATADINE 10 MG PO TABS: 10.0000 mg | ORAL_TABLET | Freq: Every day | ORAL | Status: DC | PRN

## 2019-09-26 MED ORDER — STERILE WATER FOR IRRIGATION IR SOLN
Status: DC | PRN
  Administered 2019-09-26: 2000 mL

## 2019-09-26 MED ORDER — HYDROCODONE-ACETAMINOPHEN 7.5-325 MG PO TABS
1.0000 | ORAL_TABLET | ORAL | Status: DC | PRN
  Administered 2019-09-26 – 2019-09-27 (×4): 1 via ORAL
  Filled 2019-09-26 (×4): qty 1

## 2019-09-26 MED ORDER — BUPIVACAINE IN DEXTROSE 0.75-8.25 % IT SOLN
INTRATHECAL | Status: DC | PRN
  Administered 2019-09-26: 1.8 mL via INTRATHECAL

## 2019-09-26 MED ORDER — ZOLPIDEM TARTRATE 5 MG PO TABS: 5.0000 mg | ORAL_TABLET | Freq: Every evening | ORAL | Status: DC | PRN

## 2019-09-26 MED ORDER — MORPHINE SULFATE (PF) 2 MG/ML IV SOLN: 0.5000 mg | INTRAVENOUS | Status: DC | PRN

## 2019-09-26 MED ORDER — POLYETHYLENE GLYCOL 3350 17 G PO PACK: 17.0000 g | PACK | Freq: Every day | ORAL | Status: DC | PRN

## 2019-09-26 MED ORDER — ALUM & MAG HYDROXIDE-SIMETH 200-200-20 MG/5ML PO SUSP: 30.0000 mL | ORAL | Status: DC | PRN

## 2019-09-26 MED ORDER — FLUTICASONE PROPIONATE 50 MCG/ACT NA SUSP
1.0000 | Freq: Every day | NASAL | Status: DC | PRN
  Filled 2019-09-26: qty 16

## 2019-09-26 MED ORDER — PROPOFOL 500 MG/50ML IV EMUL
INTRAVENOUS | Status: DC | PRN
Start: 2019-09-26 — End: 2019-09-26
  Administered 2019-09-26: 100 ug/kg/min via INTRAVENOUS

## 2019-09-26 SURGICAL SUPPLY — 60 items
ARTICULEZE HEAD (Hips) ×3 IMPLANT
BIT DRILL 2.0X128 (BIT) ×2 IMPLANT
BIT DRILL 2.0X128MM (BIT) ×1
BLADE SAW SAG 73X25 THK (BLADE) ×1
BLADE SAW SGTL 73X25 THK (BLADE) ×2 IMPLANT
CLOSURE STERI-STRIP 1/2X4 (GAUZE/BANDAGES/DRESSINGS) ×2
CLSR STERI-STRIP ANTIMIC 1/2X4 (GAUZE/BANDAGES/DRESSINGS) ×4 IMPLANT
COVER SURGICAL LIGHT HANDLE (MISCELLANEOUS) ×3 IMPLANT
COVER WAND RF STERILE (DRAPES) IMPLANT
DRAPE INCISE IOBAN 66X45 STRL (DRAPES) ×3 IMPLANT
DRAPE ORTHO SPLIT 77X108 STRL (DRAPES) ×6
DRAPE POUCH INSTRU U-SHP 10X18 (DRAPES) ×3 IMPLANT
DRAPE SHEET LG 3/4 BI-LAMINATE (DRAPES) ×3 IMPLANT
DRAPE SURG 17X11 SM STRL (DRAPES) ×3 IMPLANT
DRAPE SURG ORHT 6 SPLT 77X108 (DRAPES) ×2 IMPLANT
DRAPE U-SHAPE 47X51 STRL (DRAPES) ×3 IMPLANT
DRSG MEPILEX BORDER 4X8 (GAUZE/BANDAGES/DRESSINGS) ×3 IMPLANT
DURAPREP 26ML APPLICATOR (WOUND CARE) ×6 IMPLANT
ELECT BLADE TIP CTD 4 INCH (ELECTRODE) ×3 IMPLANT
ELECT REM PT RETURN 15FT ADLT (MISCELLANEOUS) ×3 IMPLANT
ELIMINATOR HOLE APEX DEPUY (Hips) ×2 IMPLANT
FACESHIELD WRAPAROUND (MASK) ×3 IMPLANT
FACESHIELD WRAPAROUND OR TEAM (MASK) ×1 IMPLANT
GLOVE BIO SURGEON STRL SZ7 (GLOVE) ×3 IMPLANT
GLOVE BIO SURGEON STRL SZ7.5 (GLOVE) ×3 IMPLANT
GLOVE BIOGEL PI IND STRL 7.0 (GLOVE) ×1 IMPLANT
GLOVE BIOGEL PI IND STRL 8 (GLOVE) ×1 IMPLANT
GLOVE BIOGEL PI INDICATOR 7.0 (GLOVE) ×2
GLOVE BIOGEL PI INDICATOR 8 (GLOVE) ×2
GOWN STRL REUS W/TWL LRG LVL3 (GOWN DISPOSABLE) ×6 IMPLANT
HEAD ARTICULEZE (Hips) IMPLANT
HOOD PEEL AWAY FLYTE STAYCOOL (MISCELLANEOUS) ×9 IMPLANT
KIT BASIN (CUSTOM PROCEDURE TRAY) ×3 IMPLANT
KIT TURNOVER KIT A (KITS) ×3 IMPLANT
LINER NEUTRAL 52X36MM PLUS 4 (Liner) ×2 IMPLANT
MANIFOLD NEPTUNE II (INSTRUMENTS) ×3 IMPLANT
NDL SAFETY ECLIPSE 18X1.5 (NEEDLE) ×2 IMPLANT
NEEDLE HYPO 18GX1.5 SHARP (NEEDLE) ×6
NS IRRIG 1000ML POUR BTL (IV SOLUTION) ×3 IMPLANT
PACK TOTAL JOINT (CUSTOM PROCEDURE TRAY) ×3 IMPLANT
PENCIL SMOKE EVACUATOR (MISCELLANEOUS) IMPLANT
PIN SECTOR W/GRIP ACE CUP 52MM (Hips) ×2 IMPLANT
PROTECTOR NERVE ULNAR (MISCELLANEOUS) ×3 IMPLANT
RETRIEVER SUT HEWSON (MISCELLANEOUS) ×3 IMPLANT
SCREW 6.5MMX25MM (Screw) ×2 IMPLANT
STEM OFFSET DUOFIX SZ6 STD (Stem) ×2 IMPLANT
SUCTION FRAZIER HANDLE 12FR (TUBING) ×3
SUCTION TUBE FRAZIER 12FR DISP (TUBING) ×1 IMPLANT
SUT FIBERWIRE #2 38 REV NDL BL (SUTURE) ×9
SUT VIC AB 0 CT1 36 (SUTURE) ×3 IMPLANT
SUT VIC AB 1 CT1 36 (SUTURE) ×6 IMPLANT
SUT VIC AB 2-0 CT1 27 (SUTURE) ×6
SUT VIC AB 2-0 CT1 TAPERPNT 27 (SUTURE) ×2 IMPLANT
SUT VIC AB 3-0 SH 8-18 (SUTURE) ×3 IMPLANT
SUTURE FIBERWR#2 38 REV NDL BL (SUTURE) ×3 IMPLANT
SYR CONTROL 10ML LL (SYRINGE) ×6 IMPLANT
TOWEL OR 17X26 10 PK STRL BLUE (TOWEL DISPOSABLE) ×3 IMPLANT
TRAY FOLEY MTR SLVR 16FR STAT (SET/KITS/TRAYS/PACK) ×3 IMPLANT
WATER STERILE IRR 1000ML POUR (IV SOLUTION) ×6 IMPLANT
YANKAUER SUCT BULB TIP 10FT TU (MISCELLANEOUS) ×3 IMPLANT

## 2019-09-26 NOTE — Transfer of Care (Signed)
Immediate Anesthesia Transfer of Care Note  Patient: Victoria Lyons  Procedure(s) Performed: TOTAL HIP ARTHROPLASTY (Right Hip)  Patient Location: PACU  Anesthesia Type:Spinal and MAC combined with regional for post-op pain  Level of Consciousness: awake, sedated and patient cooperative  Airway & Oxygen Therapy: Patient Spontanous Breathing and Patient connected to face mask oxygen  Post-op Assessment: Report given to RN and Post -op Vital signs reviewed and stable  Post vital signs: stable  Last Vitals:  Vitals Value Taken Time  BP 117/77 09/26/19 1346  Temp    Pulse 90 09/26/19 1348  Resp 17 09/26/19 1348  SpO2 100 % 09/26/19 1348  Vitals shown include unvalidated device data.  Last Pain:  Vitals:   09/26/19 0828  TempSrc: Oral         Complications: No apparent anesthesia complications

## 2019-09-26 NOTE — Anesthesia Procedure Notes (Signed)
Spinal  Patient location during procedure: OR Staffing Anesthesiologist: Lewie Loron, MD Preanesthetic Checklist Completed: patient identified, IV checked, site marked, risks and benefits discussed, surgical consent, monitors and equipment checked, pre-op evaluation and timeout performed Spinal Block Patient position: sitting Prep: DuraPrep and site prepped and draped Patient monitoring: heart rate, cardiac monitor, continuous pulse ox and blood pressure Approach: right paramedian Location: L2-3 Injection technique: single-shot Needle Needle type: Sprotte  Needle gauge: 24 G Needle length: 9 cm Assessment Sensory level: T6

## 2019-09-26 NOTE — Plan of Care (Signed)

## 2019-09-26 NOTE — Anesthesia Postprocedure Evaluation (Signed)
Anesthesia Post Note  Patient: Victoria Lyons  Procedure(s) Performed: TOTAL HIP ARTHROPLASTY (Right Hip)     Patient location during evaluation: PACU Anesthesia Type: Spinal Level of consciousness: sedated and patient cooperative Pain management: pain level controlled Vital Signs Assessment: post-procedure vital signs reviewed and stable Respiratory status: spontaneous breathing Cardiovascular status: stable Anesthetic complications: no    Last Vitals:  Vitals:   09/26/19 1345 09/26/19 1400  BP: 117/77 116/79  Pulse: 92 84  Resp: 14 16  Temp: 36.6 C   SpO2: 100% 100%    Last Pain:  Vitals:   09/26/19 1400  TempSrc:   PainSc: 10-Worst pain ever                 Lewie Loron

## 2019-09-26 NOTE — Anesthesia Preprocedure Evaluation (Addendum)
Anesthesia Evaluation  Patient identified by MRN, date of birth, ID band Patient awake    Reviewed: Allergy & Precautions, NPO status , Patient's Chart, lab work & pertinent test results  Airway Mallampati: II  TM Distance: >3 FB Neck ROM: Full    Dental  (+) Partial Lower, Partial Upper, Dental Advisory Given   Pulmonary neg pulmonary ROS,    Pulmonary exam normal breath sounds clear to auscultation       Cardiovascular hypertension, Pt. on medications  Rhythm:Regular Rate:Normal + Systolic murmurs    Neuro/Psych negative neurological ROS  negative psych ROS   GI/Hepatic negative GI ROS, Neg liver ROS,   Endo/Other  Hypothyroidism   Renal/GU negative Renal ROS     Musculoskeletal  (+) Arthritis ,   Abdominal   Peds  Hematology negative hematology ROS (+)   Anesthesia Other Findings   Reproductive/Obstetrics negative OB ROS                            Anesthesia Physical Anesthesia Plan  ASA: III  Anesthesia Plan: Spinal   Post-op Pain Management:    Induction: Intravenous  PONV Risk Score and Plan: 3 and Ondansetron, Propofol infusion, TIVA and Treatment may vary due to age or medical condition  Airway Management Planned: Natural Airway  Additional Equipment: None  Intra-op Plan:   Post-operative Plan:   Informed Consent: I have reviewed the patients History and Physical, chart, labs and discussed the procedure including the risks, benefits and alternatives for the proposed anesthesia with the patient or authorized representative who has indicated his/her understanding and acceptance.     Dental advisory given  Plan Discussed with: CRNA  Anesthesia Plan Comments:        Anesthesia Quick Evaluation

## 2019-09-26 NOTE — Discharge Instructions (Signed)
INSTRUCTIONS AFTER JOINT REPLACEMENT   o Remove items at home which could result in a fall. This includes throw rugs or furniture in walking pathways o ICE to the affected joint every three hours while awake for 30 minutes at a time, for at least the first 3-5 days, and then as needed for pain and swelling.  Continue to use ice for pain and swelling. You may notice swelling that will progress down to the foot and ankle.  This is normal after surgery.  Elevate your leg when you are not up walking on it.   o Continue to use the breathing machine you got in the hospital (incentive spirometer) which will help keep your temperature down.  It is common for your temperature to cycle up and down following surgery, especially at night when you are not up moving around and exerting yourself.  The breathing machine keeps your lungs expanded and your temperature down.   DIET:  As you were doing prior to hospitalization, we recommend a well-balanced diet.  DRESSING / WOUND CARE / SHOWERING  You may change your dressing 3-5 days after surgery.  Then change the dressing every day with sterile gauze.  Please use good hand washing techniques before changing the dressing.  Do not use any lotions or creams on the incision until instructed by your surgeon.  ACTIVITY  o Increase activity slowly as tolerated, but follow the weight bearing instructions below.   o No driving for 6 weeks or until further direction given by your physician.  You cannot drive while taking narcotics.  o No lifting or carrying greater than 10 lbs. until further directed by your surgeon. o Avoid periods of inactivity such as sitting longer than an hour when not asleep. This helps prevent blood clots.  o You may return to work once you are authorized by your doctor.     WEIGHT BEARING   Weight bearing as tolerated with assist device (walker, cane, etc) as directed, use it as long as suggested by your surgeon or therapist, typically at  least 4-6 weeks.   EXERCISES  Results after joint replacement surgery are often greatly improved when you follow the exercise, range of motion and muscle strengthening exercises prescribed by your doctor. Safety measures are also important to protect the joint from further injury. Any time any of these exercises cause you to have increased pain or swelling, decrease what you are doing until you are comfortable again and then slowly increase them. If you have problems or questions, call your caregiver or physical therapist for advice.   Rehabilitation is important following a joint replacement. After just a few days of immobilization, the muscles of the leg can become weakened and shrink (atrophy).  These exercises are designed to build up the tone and strength of the thigh and leg muscles and to improve motion. Often times heat used for twenty to thirty minutes before working out will loosen up your tissues and help with improving the range of motion but do not use heat for the first two weeks following surgery (sometimes heat can increase post-operative swelling).   These exercises can be done on a training (exercise) mat, on the floor, on a table or on a bed. Use whatever works the best and is most comfortable for you.    Use music or television while you are exercising so that the exercises are a pleasant break in your day. This will make your life better with the exercises acting as a break   in your routine that you can look forward to.   Perform all exercises about fifteen times, three times per day or as directed.  You should exercise both the operative leg and the other leg as well.  Exercises include:   . Quad Sets - Tighten up the muscle on the front of the thigh (Quad) and hold for 5-10 seconds.   . Straight Leg Raises - With your knee straight (if you were given a brace, keep it on), lift the leg to 60 degrees, hold for 3 seconds, and slowly lower the leg.  Perform this exercise against  resistance later as your leg gets stronger.  . Leg Slides: Lying on your back, slowly slide your foot toward your buttocks, bending your knee up off the floor (only go as far as is comfortable). Then slowly slide your foot back down until your leg is flat on the floor again.  . Angel Wings: Lying on your back spread your legs to the side as far apart as you can without causing discomfort.  . Hamstring Strength:  Lying on your back, push your heel against the floor with your leg straight by tightening up the muscles of your buttocks.  Repeat, but this time bend your knee to a comfortable angle, and push your heel against the floor.  You may put a pillow under the heel to make it more comfortable if necessary.   A rehabilitation program following joint replacement surgery can speed recovery and prevent re-injury in the future due to weakened muscles. Contact your doctor or a physical therapist for more information on knee rehabilitation.    CONSTIPATION  Constipation is defined medically as fewer than three stools per week and severe constipation as less than one stool per week.  Even if you have a regular bowel pattern at home, your normal regimen is likely to be disrupted due to multiple reasons following surgery.  Combination of anesthesia, postoperative narcotics, change in appetite and fluid intake all can affect your bowels.   YOU MUST use at least one of the following options; they are listed in order of increasing strength to get the job done.  They are all available over the counter, and you may need to use some, POSSIBLY even all of these options:    Drink plenty of fluids (prune juice may be helpful) and high fiber foods Colace 100 mg by mouth twice a day  Senokot for constipation as directed and as needed Dulcolax (bisacodyl), take with full glass of water  Miralax (polyethylene glycol) once or twice a day as needed.  If you have tried all these things and are unable to have a bowel  movement in the first 3-4 days after surgery call either your surgeon or your primary doctor.    If you experience loose stools or diarrhea, hold the medications until you stool forms back up.  If your symptoms do not get better within 1 week or if they get worse, check with your doctor.  If you experience "the worst abdominal pain ever" or develop nausea or vomiting, please contact the office immediately for further recommendations for treatment.   ITCHING:  If you experience itching with your medications, try taking only a single pain pill, or even half a pain pill at a time.  You can also use Benadryl over the counter for itching or also to help with sleep.   TED HOSE STOCKINGS:  Use stockings on both legs until for at least 2 weeks or as   directed by physician office. They may be removed at night for sleeping.  MEDICATIONS:  See your medication summary on the "After Visit Summary" that nursing will review with you.  You may have some home medications which will be placed on hold until you complete the course of blood thinner medication.  It is important for you to complete the blood thinner medication as prescribed.  PRECAUTIONS:  If you experience chest pain or shortness of breath - call 911 immediately for transfer to the hospital emergency department.   If you develop a fever greater that 101 F, purulent drainage from wound, increased redness or drainage from wound, foul odor from the wound/dressing, or calf pain - CONTACT YOUR SURGEON.                                                   FOLLOW-UP APPOINTMENTS:  If you do not already have a post-op appointment, please call the office for an appointment to be seen by your surgeon.  Guidelines for how soon to be seen are listed in your "After Visit Summary", but are typically between 1-4 weeks after surgery.  OTHER INSTRUCTIONS:   Knee Replacement:  Do not place pillow under knee, focus on keeping the knee straight while resting. CPM  instructions: 0-90 degrees, 2 hours in the morning, 2 hours in the afternoon, and 2 hours in the evening. Place foam block, curve side up under heel at all times except when in CPM or when walking.  DO NOT modify, tear, cut, or change the foam block in any way.   DENTAL ANTIBIOTICS:  In most cases prophylactic antibiotics for Dental procdeures after total joint surgery are not necessary.  Exceptions are as follows:  1. History of prior total joint infection  2. Severely immunocompromised (Organ Transplant, cancer chemotherapy, Rheumatoid biologic meds such as Humera)  3. Poorly controlled diabetes (A1C &gt; 8.0, blood glucose over 200)  If you have one of these conditions, contact your surgeon for an antibiotic prescription, prior to your dental procedure.   MAKE SURE YOU:  . Understand these instructions.  . Get help right away if you are not doing well or get worse.    Thank you for letting us be a part of your medical care team.  It is a privilege we respect greatly.  We hope these instructions will help you stay on track for a fast and full recovery!   

## 2019-09-26 NOTE — Op Note (Signed)
09/26/2019  1:55 PM  PATIENT:  Victoria Lyons   MRN: [id number]  PRE-OPERATIVE DIAGNOSIS:  Right hip localized osteoarthritis, secondary to dysplasia  POST-OPERATIVE DIAGNOSIS:  same  PROCEDURE:  Procedure(s): RIGHT TOTAL HIP ARTHROPLASTY  PREOPERATIVE INDICATIONS:    Victoria Lyons is an 79 y.o. female who has a diagnosis of right hip dysplasia with arthrosis and elected for surgical management after failing conservative treatment.  The risks benefits and alternatives were discussed with the patient including but not limited to the risks of nonoperative treatment, versus surgical intervention including infection, bleeding, nerve injury, periprosthetic fracture, the need for revision surgery, dislocation, leg length discrepancy, blood clots, cardiopulmonary complications, morbidity, mortality, among others, and they were willing to proceed.     OPERATIVE REPORT     SURGEON:  Marchia Bond, MD    ASSISTANT:  Merlene Pulling, PA-C, (Present throughout the entire procedure,  necessary for completion of procedure in a timely manner, assisting with retraction, instrumentation, and closure)     ANESTHESIA: Spinal  ESTIMATED BLOOD LOSS: 696 mL    COMPLICATIONS:  None.     UNIQUE ASPECTS OF THE CASE: The neck was fairly long and valgus.  The acetabulum was somewhat shallow, and dysplastic, there poor quality bone superiorly.  I reamed to a 51, but the cup would not stick, so I reamed to 52 and then was able to get a stick that was fair, the backup screw felt like it was bumping against the cortex, I only drilled to 20, and used a 25 mm screw, but I could not get the screw fully seated, so I redrilled, and then was able to get adequate purchase, although it was somewhat less gratifying than I would like.  Nonetheless I had a stable fixation on the cup and the position remained stable throughout the case.  While trialing the femur, I did ream to a 7, but the 6 was fairly tight, and I could not  get the 6 even fully down, I had 1 tooth showing.  There was a fair amount of shock with the 1.5, the 5 eliminated the shock, although she is felt slightly long compared to the other side, but other side also has some degree of dysplasia and shortening as well.  The hip was extremely stable after all the implants were in.  COMPONENTS:  Chamizal Duofix Press fit femur size 6 with a 36 mm +5 metallic head ball and a Gription Acetabular shell size 52, with a single cancellous screw for backup fixation, with an apex hole eliminator and a +4 neutral polyethylene liner.    PROCEDURE IN DETAIL:   The patient was met in the holding area and  identified.  The appropriate hip was identified and marked at the operative site.  The patient was then transported to the OR  and  placed under spinal anesthesia.  Foley was placed.  At that point, the patient was  placed in the lateral decubitus position with the operative side up and  secured to the operating room table and all bony prominences padded.     The operative lower extremity was prepped from the iliac crest to the distal leg.  Sterile draping was performed.  Time out was performed prior to incision.      A routine posterolateral approach was utilized via sharp dissection  carried down to the subcutaneous tissue.  Gross bleeders were Bovie coagulated.  The iliotibial band was identified and incised along the length of the skin incision.  Self-retaining retractors were  inserted.  With the hip internally rotated, the short external rotators  were identified. The piriformis and capsule was tagged with FiberWire, and the hip capsule released in a T-type fashion.  The femoral neck was exposed, and I resected the femoral neck using the appropriate jig. This was performed at approximately a thumb's breadth above the lesser trochanter.    I then exposed the deep acetabulum, cleared out any tissue including the ligamentum teres.  A wing retractor was placed.   After adequate visualization, I excised the labrum, and then sequentially reamed.  I placed the trial acetabulum, which seated nicely, and then impacted the real cup into place.  Appropriate version and inclination was confirmed clinically matching their bony anatomy, and also with the use of the jig.  I placed a cancellous screw to augment fixation.  A trial polyethylene liner was placed and the wing retractor removed.    I then prepared the proximal femur using the cookie-cutter, the lateralizing reamer, and then sequentially reamed and broached.  A trial broach, neck, and head was utilized, and I reduced the hip and it was found to have excellent stability with functional range of motion. The trial components were then removed, and the real polyethylene liner was placed.  I then impacted the real femoral prosthesis into place into the appropriate version, slightly anteverted to the normal anatomy, and I impacted the real head ball into place. The hip was then reduced and taken through functional range of motion and found to have excellent stability. Leg lengths were restored.  I then used a 2 mm drill bits to pass the FiberWire suture from the capsule and piriformis through the greater trochanter, and secured this. Excellent posterior capsular repair was achieved. I also closed the T in the capsule.  I then irrigated the hip copiously again with pulse lavage, and repaired the fascia with Vicryl, followed by Vicryl for the subcutaneous tissue, Monocryl for the skin, Steri-Strips and sterile gauze. The wounds were injected. The patient was then awakened and returned to PACU in stable and satisfactory condition. There were no complications.  Marchia Bond, MD Orthopedic Surgeon [phone]   09/26/2019 1:55 PM

## 2019-09-26 NOTE — H&P (Signed)
PREOPERATIVE H&P  Chief Complaint: Right hip pain  HPI: Victoria Lyons is a 79 y.o. female who presents for preoperative history and physical with a diagnosis of right hip dysplasia with osteoarthritis. Symptoms are rated as moderate to severe, and have been worsening.  This is significantly impairing activities of daily living.  She has elected for surgical management.   She has failed injections, activity modification, anti-inflammatories, and assistive devices.  Preoperative X-rays demonstrate end stage degenerative changes with osteophyte formation, loss of joint space, subchondral sclerosis.   Past Medical History:  Diagnosis Date  . Cataract   . DDD (degenerative disc disease), lumbar   . Dermatitis   . Glaucoma   . Hyperlipidemia   . Hypertension   . Hypothyroidism   . Multinodular goiter   . 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 Osteopenia   . Seasonal allergies   . Vertigo    Past Surgical History:  Procedure Laterality Date  . ABDOMINAL HYSTERECTOMY    . ANKLE SURGERY Left   . BREAST BIOPSY Right   . COLONOSCOPY    . HAND SURGERY Right   . SHOULDER SURGERY Bilateral   . thyroid nodule removal     Social History   Socioeconomic History  . Marital status: Divorced    Spouse name: Not on file  . Number of children: Not on file  . Years of education: Not on file  . Highest education level: Not on file  Occupational History  . Not on file  Tobacco Use  . Smoking status: Never Smoker  . Smokeless tobacco: Never Used  Substance and Sexual Activity  . Alcohol use: Never  . Drug use: Never  . Sexual activity: Not on file  Other Topics Concern  . Not on file  Social History Narrative  . Not on file   Social Determinants of Health   Financial Resource Strain:   . Difficulty of Paying Living Expenses:   Food Insecurity:   . Worried About Programme researcher, broadcasting/film/video in the Last Year:   . Barista in the Last Year:   Transportation Needs:   .  Freight forwarder (Medical):   Marland Kitchen Lack of Transportation (Non-Medical):   Physical Activity:   . Days of Exercise per Week:   . Minutes of Exercise per Session:   Stress:   . Feeling of Stress :   Social Connections:   . Frequency of Communication with Friends and Family:   . Frequency of Social Gatherings with Friends and Family:   . Attends Religious Services:   . Active Member of Clubs or Organizations:   . Attends Banker Meetings:   Marland Kitchen Marital Status:    History reviewed. No pertinent family history. Allergies  Allergen Reactions  . Demeclocycline Other (See Comments)    Unsure of exact reaction type  . Latex Rash  . Tetanus Toxoids Rash  . Tetracyclines & Related Rash   Prior to Admission medications   Medication Sig Start Date End Date Taking? Authorizing Provider  amLODipine (NORVASC) 5 MG tablet Take 5 mg by mouth daily.   Yes [provider]  fluticasone (FLONASE) 50 MCG/ACT nasal spray Place 1 spray into both nostrils daily as needed for allergies. 07/14/19  Yes [provider]  loratadine (CLARITIN) 10 MG tablet Take 10 mg by mouth daily as needed for allergies.   Yes [provider]  olopatadine (PATANOL) 0.1 % ophthalmic solution Place 1  drop into both eyes 2 (two) times daily as needed for allergies. 07/14/19  Yes [provider]     Positive ROS: All other systems have been reviewed and were otherwise negative with the exception of those mentioned in the HPI and as above.  Physical Exam: General: Alert, no acute distress Cardiovascular: No pedal edema Respiratory: No cyanosis, no use of accessory musculature GI: No organomegaly, abdomen is soft and non-tender Skin: No lesions in the area of chief complaint Neurologic: Sensation intact distally Psychiatric: Patient is competent for consent with normal mood and affect Lymphatic: No axillary or cervical lymphadenopathy  MUSCULOSKELETAL: Right hip has 0 to 95  mm of motion with 15 degrees of internal and external rotation.  EHL and FHL are intact.  Assessment: Right hip dysplasia with osteoarthritis   Plan: Plan for Procedure(s): TOTAL HIP ARTHROPLASTY  The risks benefits and alternatives were discussed with the patient including but not limited to the risks of nonoperative treatment, versus surgical intervention including infection, bleeding, nerve injury, periprosthetic fracture, the need for revision surgery, dislocation, leg length discrepancy, blood clots, cardiopulmonary complications, morbidity, mortality, among others, and they were willing to proceed.      Patient's anticipated LOS is less than 2 midnights, meeting these requirements: - Younger than 40 - Lives within 1 hour of care - Has a competent adult at home to recover with post-op recover - NO history of  - Chronic pain requiring opiods  - Diabetes  - Coronary Artery Disease  - Heart failure  - Heart attack  - Stroke  - DVT/VTE  - Cardiac arrhythmia  - Respiratory Failure/COPD  - Renal failure  - Anemia  - Advanced Liver disease        Victoria Bridge, MD Cell [phone]   09/26/2019 10:03 AM

## 2019-09-27 ENCOUNTER — Encounter: Payer: Self-pay | Admitting: *Deleted

## 2019-09-27 DIAGNOSIS — M1611 Unilateral primary osteoarthritis, right hip: Secondary | ICD-10-CM | POA: Diagnosis not present

## 2019-09-27 LAB — CBC
HCT: 38.2 % (ref 36.0–46.0)
Hemoglobin: 12.1 g/dL (ref 12.0–15.0)
MCH: 27.4 pg (ref 26.0–34.0)
MCHC: 31.7 g/dL (ref 30.0–36.0)
MCV: 86.4 fL (ref 80.0–100.0)
Platelets: 236 10*3/uL (ref 150–400)
RBC: 4.42 MIL/uL (ref 3.87–5.11)
RDW: 14.6 % (ref 11.5–15.5)
WBC: 9.3 10*3/uL (ref 4.0–10.5)
nRBC: 0 % (ref 0.0–0.2)

## 2019-09-27 LAB — BASIC METABOLIC PANEL
Anion gap: 10 (ref 5–15)
BUN: 15 mg/dL (ref 8–23)
CO2: 24 mmol/L (ref 22–32)
Calcium: 8.7 mg/dL — ABNORMAL LOW (ref 8.9–10.3)
Chloride: 103 mmol/L (ref 98–111)
Creatinine, Ser: 1 mg/dL (ref 0.44–1.00)
GFR calc Af Amer: >60 mL/min (ref >60)
GFR calc non Af Amer: 54 mL/min — ABNORMAL LOW (ref >60)
Glucose, Bld: 131 mg/dL — ABNORMAL HIGH (ref 70–99)
Potassium: 4 mmol/L (ref 3.5–5.1)
Sodium: 137 mmol/L (ref 135–145)

## 2019-09-27 NOTE — Evaluation (Signed)
Occupational Therapy Evaluation Patient Details Name: NGOZI ALVIDREZ MRN: [id number] DOB: 1940-10-09 Today's Date: 09/27/2019    History of Present Illness Pt is a 79 year old female s/p Right THA posterior approach.   Clinical Impression   Mrs. Nivea Wojdyla is a 79 year old woman s/p hip surgery with decreased ROM and strength of RLE and posterior hip precautions resulting in decreased independence with mobility and ADLs. Patient provided with education and instruction on compensatory strategies secondary to posterior hip precautions for dressing, bathing and toileting. Patient provided with instruction on posterior hip precautions. Patient and patient's daughter verbalized understanding and patient demonstrated use of ADL kit with dressing. No further OT needs at this time.    Follow Up Recommendations  No OT follow up    Equipment Recommendations  3 in 1 bedside commode;Other (comment)(ADL kit)    Recommendations for Other Services       Precautions / Restrictions Precautions Precautions: Posterior Hip;Fall Precaution Comments: reviewed posterior hip precautions with pt and daughter, provided handout Restrictions Weight Bearing Restrictions: No      Mobility Bed Mobility               General bed mobility comments: Patient seated in recliner.  Transfers Overall transfer level: Needs assistance Equipment used: Rolling walker (2 wheeled) Transfers: Sit to/from Stand Sit to Stand: Min guard         General transfer comment: min guard  and rw for ambulation and verbal cues for technique to maintain hip precautions.    Balance Overall balance assessment: No apparent balance deficits (not formally assessed)                                         ADL either performed or assessed with clinical judgement   ADL Overall ADL's : Needs assistance/impaired Eating/Feeding: Independent   Grooming: Independent   Upper Body Bathing: Set  up;Sitting   Lower Body Bathing: Set up;Moderate assistance;Adhering to hip precautions Lower Body Bathing Details (indicate cue type and reason): Mod assist to perform lower body bathing secondary hip precautions; patient educated on use of AE for bathing. Upper Body Dressing : Set up;Sitting   Lower Body Dressing: Sit to/from stand;Min guard;Adhering to hip precautions;With adaptive equipment;Minimal assistance Lower Body Dressing Details (indicate cue type and reason): Min assist to perform lower body dressing after instruction of AE. Patinet able to donn underwear, pants and shoes. Patient needed assistane for laces on shoes. min guard for safety. Toilet Transfer: Manufacturing engineer and Hygiene: Min guard;Sit to/from stand;Adhering to hip precautions     Tub/Shower Transfer Details (indicate cue type and reason): recommended use of shower chair for bathing. Functional mobility during ADLs: Min guard;Rolling walker       Vision         Perception     Praxis      Pertinent Vitals/Pain Pain Assessment: 0-10 Pain Score: 4  Pain Location: right hip Pain Descriptors / Indicators: Aching;Sore Pain Intervention(s): Repositioned;RN gave pain meds during session;Monitored during session     Hand Dominance     Extremity/Trunk Assessment Upper Extremity Assessment Upper Extremity Assessment: Overall WFL for tasks assessed   Lower Extremity Assessment RLE Deficits / Details: anticipated post op hip weakness, pt able to perform ankle pumps, hip grossly 2+/5, maintained precautions       Communication Communication Communication: No difficulties  Cognition Arousal/Alertness: Awake/alert Behavior During Therapy: WFL for tasks assessed/performed Overall Cognitive Status: Within Functional Limits for tasks assessed                                     General Comments       Exercises Total Joint Exercises Hip  ABduction/ADduction: AROM;10 reps;Right;Standing Knee Flexion: AROM;Right;10 reps;Standing Marching in Standing: AROM;Right;10 reps;Standing Standing Hip Extension: AROM;Right;10 reps;Standing   Shoulder Instructions      Home Living Family/patient expects to be discharged to:: Private residence Living Arrangements: Alone Available Help at Discharge: Available 24 hours/day;Family(daughter from [facility] is coming to stay with pt) Type of Home: House Home Access: Stairs to enter Technical brewer of Steps: 1 Entrance Stairs-Rails: None Home Layout: One level               Home Equipment: None          Prior Functioning/Environment Level of Independence: Independent                 OT Problem List: Decreased range of motion;Decreased strength;Decreased activity tolerance;Decreased knowledge of precautions;Decreased knowledge of use of DME or AE      OT Treatment/Interventions:      OT Goals(Current goals can be found in the care plan section) Acute Rehab OT Goals Patient Stated Goal: to go home OT Goal Formulation: All assessment and education complete, DC therapy  OT Frequency:     Barriers to D/C:            Co-evaluation              AM-PAC OT "6 Clicks" Daily Activity     Outcome Measure Help from another person eating meals?: None Help from another person taking care of personal grooming?: None Help from another person toileting, which includes using toliet, bedpan, or urinal?: A Little Help from another person bathing (including washing, rinsing, drying)?: A Lot Help from another person to put on and taking off regular upper body clothing?: A Little Help from another person to put on and taking off regular lower body clothing?: A Lot 6 Click Score: 18   End of Session Equipment Utilized During Treatment: Gait belt;Rolling walker Nurse Communication: Mobility status  Activity Tolerance: Patient tolerated treatment well Patient left: in  chair;with call bell/phone within reach;with bed alarm set  OT Visit Diagnosis: Other abnormalities of gait and mobility (R26.89);Muscle weakness (generalized) (M62.81)                Time: 1314-3888 OT Time Calculation (min): 29 min Charges:  OT General Charges $OT Visit: 1 Visit OT Evaluation $OT Eval Low Complexity: 1 Low OT Treatments $Self Care/Home Management : 8-22 mins  Derl Barrow, OTR/L Acute Care Rehab Services  Office [phone]   Lenward Chancellor 09/27/2019, 5:09 PM

## 2019-09-27 NOTE — Progress Notes (Signed)
Subjective: 1 Day Post-Op s/p Procedure(s): TOTAL HIP ARTHROPLASTY   Patient is alert, oriented, sitting comfortably in bed. Reports no pain this morning. She had some pain in her left shoulder directly after surgery, but she has had this for the last few months. Denies chest pain, SOB, Calf pain, she has been working on her incentive spirometry. Denies nausea/vomiting or abdominal pain. No other complaints.  Objective:  PE: VITALS:   Vitals:   09/26/19 2009 09/26/19 2120 09/27/19 0250 09/27/19 0700  BP: 132/77 132/86 125/79 137/74  Pulse: 75 79 66 68  Resp: 14 16 16 15   Temp: 97.8 F (36.6 C) 97.7 F (36.5 C) 97.8 F (36.6 C) (!) 97.5 F (36.4 C)  TempSrc:  Oral    SpO2: 100% 100% 100% 100%  Weight:      Height:        ABD soft Neurovascular intact Sensation intact distally Intact pulses distally Dorsiflexion/Plantar flexion intact Incision: scant drainage    LABS  Results for orders placed or performed during the hospital encounter of 09/26/19 (from the past 24 hour(s))  CBC     Status: None   Collection Time: 09/27/19  3:10 AM  Result Value Ref Range   WBC 9.3 4.0 - 10.5 K/uL   RBC 4.42 3.87 - 5.11 MIL/uL   Hemoglobin 12.1 12.0 - 15.0 g/dL   HCT 38.2 36.0 - 46.0 %   MCV 86.4 80.0 - 100.0 fL   MCH 27.4 26.0 - 34.0 pg   MCHC 31.7 30.0 - 36.0 g/dL   RDW 14.6 11.5 - 15.5 %   Platelets 236 150 - 400 K/uL   nRBC 0.0 0.0 - 0.2 %  Basic metabolic panel     Status: Abnormal   Collection Time: 09/27/19  3:10 AM  Result Value Ref Range   Sodium 137 135 - 145 mmol/L   Potassium 4.0 3.5 - 5.1 mmol/L   Chloride 103 98 - 111 mmol/L   CO2 24 22 - 32 mmol/L   Glucose, Bld 131 (H) 70 - 99 mg/dL   BUN 15 8 - 23 mg/dL   Creatinine, Ser 1.00 0.44 - 1.00 mg/dL   Calcium 8.7 (L) 8.9 - 10.3 mg/dL   GFR calc non Af Amer 54 (L) >60 mL/min   GFR calc Af Amer >60 >60 mL/min   Anion gap 10 5 - 15    DG Pelvis Portable  Result Date: 09/26/2019 CLINICAL DATA:  Postop  right hip replacement EXAM: PORTABLE PELVIS 1-2 VIEWS COMPARISON:  None. FINDINGS: Changes of right hip replacement. Normal AP alignment. No hardware bony complicating feature. IMPRESSION: Right hip replacement.  No visible complicating feature. Electronically Signed   By: Rolm Baptise M.D.   On: 09/26/2019 16:36   DG Hip Port Unilat With Pelvis 1V Right  Result Date: 09/26/2019 CLINICAL DATA:  Right hip replacement EXAM: DG HIP (WITH OR WITHOUT PELVIS) 1V PORT RIGHT COMPARISON:  None. FINDINGS: Changes of right hip replacement. Normal cross-table lateral appearance. No hardware complicating feature. IMPRESSION: Right hip replacement.  No visible complicating feature. Electronically Signed   By: Rolm Baptise M.D.   On: 09/26/2019 16:36    Assessment/Plan: Principal Problem:   Osteoarthritis of right hip Active Problems:   S/P hip replacement, right   Status post hip replacement, right  1 Day Post-Op s/p Procedure(s): TOTAL HIP ARTHROPLASTY  Weightbearing: WBAT RLE, up with therapy Insicional and dressing care: Reinforce dressings as needed Orthopedic device(s): None VTE prophylaxis: Aspirin  325mg  BID x 30 days Pain control: continue current regimen Follow - up plan: follow-up with Dr. in 2 weeks as outpatient Dispo: Likely discharge home later today, dependent on PT eval  Contact information:   Weekdays 8-5 12-22-2005, Janine Ores [place] A fter hours and holidays please check [email or web address] for group call information for [facility]  [phone] 09/27/2019, 8:30 AM

## 2019-09-27 NOTE — Evaluation (Signed)
Physical Therapy Evaluation Patient Details Name: Victoria Lyons MRN: [id number] DOB: Aug 31, 1940 Today's Date: 09/27/2019   History of Present Illness  Pt is a 79 year old female s/p Right THA posterior approach.  Clinical Impression  Pt is s/p THA resulting in the deficits listed below (see PT Problem List).  Pt will benefit from skilled PT to increase their independence and safety with mobility to allow discharge to the venue listed below.  Pt educated on posterior hip precautions and assisted with ambulating in hallway.  Pt also performed LE exercises.  Pt plans to d/c home and has her daughter coming to stay with her.     Follow Up Recommendations Follow surgeon's recommendation for DC plan and follow-up therapies    Equipment Recommendations  Rolling walker with 5" wheels;3in1 (PT)    Recommendations for Other Services       Precautions / Restrictions Precautions Precautions: Posterior Hip;Fall Restrictions Weight Bearing Restrictions: No      Mobility  Bed Mobility Overal bed mobility: Needs Assistance Bed Mobility: Supine to Sit     Supine to sit: Min guard;HOB elevated     General bed mobility comments: increased time and effort, verbal cues for maintaining precautions, pt self assisted R LE with UEs  Transfers Overall transfer level: Needs assistance Equipment used: Rolling walker (2 wheeled) Transfers: Sit to/from Stand Sit to Stand: Min assist         General transfer comment: assist to rise and steady, verbal cues for positioning and precautions  Ambulation/Gait Ambulation/Gait assistance: Min guard Gait Distance (Feet): 160 Feet Assistive device: Rolling walker (2 wheeled) Gait Pattern/deviations: Step-through pattern;Step-to pattern;Antalgic     General Gait Details: verbal cues for sequence, RW, positioning, maintaining hip precautions  Stairs            Wheelchair Mobility    Modified Rankin (Stroke Patients Only)       Balance                                              Pertinent Vitals/Pain Pain Assessment: 0-10 Pain Score: 4  Pain Location: right hip Pain Descriptors / Indicators: Aching;Sore Pain Intervention(s): Repositioned;Monitored during session;Ice applied    Home Living Family/patient expects to be discharged to:: Private residence Living Arrangements: Alone Available Help at Discharge: Available 24 hours/day;Family(daughter from [facility] is coming to stay with pt) Type of Home: House Home Access: Stairs to enter Entrance Stairs-Rails: None Secretary/administrator of Steps: 1 Home Layout: One level Home Equipment: None      Prior Function Level of Independence: Independent               Hand Dominance        Extremity/Trunk Assessment        Lower Extremity Assessment Lower Extremity Assessment: RLE deficits/detail RLE Deficits / Details: anticipated post op hip weakness, pt able to perform ankle pumps, hip grossly 2+/5, maintained precautions       Communication   Communication: No difficulties  Cognition Arousal/Alertness: Awake/alert Behavior During Therapy: WFL for tasks assessed/performed Overall Cognitive Status: Within Functional Limits for tasks assessed                                        General Comments  Exercises Total Joint Exercises Ankle Circles/Pumps: AROM;Both;10 reps Quad Sets: AROM;10 reps;Both Heel Slides: AAROM;Right;10 reps(all exercises within posterior hip precautions) Hip ABduction/ADduction: Right;10 reps;AAROM Long Arc Quad: AROM;Seated;Right;10 reps   Assessment/Plan    PT Assessment Patient needs continued PT services  PT Problem List Decreased activity tolerance;Decreased knowledge of use of DME;Pain;Decreased knowledge of precautions;Decreased strength;Decreased mobility       PT Treatment Interventions Stair training;Gait training;Patient/family education;Therapeutic exercise;DME  instruction;Therapeutic activities;Functional mobility training    PT Goals (Current goals can be found in the Care Plan section)  Acute Rehab PT Goals PT Goal Formulation: With patient Time For Goal Achievement: 10/02/19 Potential to Achieve Goals: Good    Frequency 7X/week   Barriers to discharge        Co-evaluation               AM-PAC PT "6 Clicks" Mobility  Outcome Measure Help needed turning from your back to your side while in a flat bed without using bedrails?: A Little Help needed moving from lying on your back to sitting on the side of a flat bed without using bedrails?: A Little Help needed moving to and from a bed to a chair (including a wheelchair)?: A Little Help needed standing up from a chair using your arms (e.g., wheelchair or bedside chair)?: A Little Help needed to walk in hospital room?: A Little Help needed climbing 3-5 steps with a railing? : A Little 6 Click Score: 18    End of Session Equipment Utilized During Treatment: Gait belt Activity Tolerance: Patient tolerated treatment well Patient left: in chair;with call bell/phone within reach;with chair alarm set Nurse Communication: Mobility status PT Visit Diagnosis: Other abnormalities of gait and mobility (R26.89)    Time: 1017-5102 PT Time Calculation (min) (ACUTE ONLY): 27 min   Charges:   PT Evaluation $PT Eval Low Complexity: 1 Low PT Treatments $Therapeutic Exercise: 8-22 mins      Jannette Spanner PT, DPT Acute Rehabilitation Services Pager: [phone] Office: [phone]   Shya Kovatch,KATHrine E 09/27/2019, 11:39 AM

## 2019-09-27 NOTE — Plan of Care (Signed)
Plan of care reviewed and discussed with the patient. 

## 2019-09-27 NOTE — Plan of Care (Signed)
All discharge instructions were given to Pt. All questions were answered. 

## 2019-09-27 NOTE — Discharge Summary (Signed)
Discharge Summary  Patient ID: Victoria Lyons MRN: [id number] DOB/AGE: December 26, 1940 79 y.o.  Admit date: 09/26/2019 Discharge date: 09/27/2019  Admission Diagnoses:  Osteoarthritis of right hip  Discharge Diagnoses:  Principal Problem:   Osteoarthritis of right hip Active Problems:   S/P hip replacement, right   Status post hip replacement, right   Past Medical History:  Diagnosis Date  . Cataract   . DDD (degenerative disc disease), lumbar   . Dermatitis   . Glaucoma   . Hyperlipidemia   . Hypertension   . Hypothyroidism   . Multinodular goiter   . 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 Osteopenia   . Seasonal allergies   . Vertigo     Surgeries: Procedure(s): TOTAL HIP ARTHROPLASTY on 09/26/2019   Consultants (if any):   Discharged Condition: Improved  Hospital Course: HERMELINDA DIEGEL is an 79 y.o. female who was admitted 09/26/2019 with a diagnosis of Osteoarthritis of right hip and went to the operating room on 09/26/2019 and underwent the above named procedures.    She was given perioperative antibiotics:  Anti-infectives (From admission, onward)   Start     Dose/Rate Route Frequency Ordered Stop   09/26/19 1800  ceFAZolin (ANCEF) IVPB 2g/100 mL premix     2 g 200 mL/hr over 30 Minutes Intravenous Every 6 hours 09/26/19 1748 09/27/19 0044   09/26/19 0800  ceFAZolin (ANCEF) IVPB 2g/100 mL premix     2 g 200 mL/hr over 30 Minutes Intravenous On call to O.R. 09/26/19 7782 09/26/19 1111    .  She was given sequential compression devices, early ambulation, and aspiring for DVT prophylaxis.  She benefited maximally from the hospital stay and there were no complications.    Recent vital signs:  Vitals:   09/27/19 0700 09/27/19 0932  BP: 137/74 129/60  Pulse: 68 79  Resp: 15 14  Temp: (!) 97.5 F (36.4 C) 97.6 F (36.4 C)  SpO2: 100% 100%    Recent laboratory studies:  Lab Results  Component Value Date   HGB 12.1 09/27/2019   HGB 13.6 09/19/2019   HGB 13.3 08/10/2017   Lab Results  Component Value Date   WBC 9.3 09/27/2019   PLT 236 09/27/2019   No results found for: INR Lab Results  Component Value Date   NA 137 09/27/2019   K 4.0 09/27/2019   CL 103 09/27/2019   CO2 24 09/27/2019   BUN 15 09/27/2019   CREATININE 1.00 09/27/2019   GLUCOSE 131 (H) 09/27/2019    Discharge Medications:   Allergies as of 09/27/2019      Reactions   Demeclocycline Other (See Comments)   Unsure of exact reaction type   Latex Rash   Tetanus Toxoids Rash   Tetracyclines & Related Rash      Medication List    TAKE these medications   amLODipine 5 MG tablet Commonly known as: NORVASC Take 5 mg by mouth daily.   aspirin EC 325 MG tablet Take 1 tablet (325 mg total) by mouth 2 (two) times daily.   baclofen 10 MG tablet Commonly known as: LIORESAL Take 1 tablet (10 mg total) by mouth 3 (three) times daily. As needed for muscle spasm   fluticasone 50 MCG/ACT nasal spray Commonly known as: FLONASE Place 1 spray into both nostrils daily as needed for allergies.   HYDROcodone-acetaminophen 5-325 MG tablet Commonly known as: Norco Take 1-2 tablets by mouth every 6 (six) hours as needed for  moderate pain. MAXIMUM TOTAL ACETAMINOPHEN DOSE IS 4000 MG PER DAY   loratadine 10 MG tablet Commonly known as: CLARITIN Take 10 mg by mouth daily as needed for allergies.   olopatadine 0.1 % ophthalmic solution Commonly known as: PATANOL Place 1 drop into both eyes 2 (two) times daily as needed for allergies.   ondansetron 4 MG tablet Commonly known as: Zofran Take 1 tablet (4 mg total) by mouth every 8 (eight) hours as needed for nausea or vomiting.   sennosides-docusate sodium 8.6-50 MG tablet Commonly known as: SENOKOT-S Take 2 tablets by mouth daily.            Durable Medical Equipment  (From admission, onward)         Start     Ordered   09/27/19 1118  For home use only DME Bedside commode  Once    Question:  Patient needs a  bedside commode to treat with the following condition  Answer:  S/P hip replacement, right   09/27/19 1117          Diagnostic Studies: DG Pelvis Portable  Result Date: 09/26/2019 CLINICAL DATA:  Postop right hip replacement EXAM: PORTABLE PELVIS 1-2 VIEWS COMPARISON:  None. FINDINGS: Changes of right hip replacement. Normal AP alignment. No hardware bony complicating feature. IMPRESSION: Right hip replacement.  No visible complicating feature. Electronically Signed   By: Rolm Baptise M.D.   On: 09/26/2019 16:36   DG Hip Port Unilat With Pelvis 1V Right  Result Date: 09/26/2019 CLINICAL DATA:  Right hip replacement EXAM: DG HIP (WITH OR WITHOUT PELVIS) 1V PORT RIGHT COMPARISON:  None. FINDINGS: Changes of right hip replacement. Normal cross-table lateral appearance. No hardware complicating feature. IMPRESSION: Right hip replacement.  No visible complicating feature. Electronically Signed   By: Rolm Baptise M.D.   On: 09/26/2019 16:36    Disposition: Discharge disposition: 01-Home or Self Care         Follow-up Information    Marchia Bond, MD. Schedule an appointment as soon as possible for a visit in 2 weeks.   Specialty: Orthopedic Surgery Contact information: [address] [place] [address] [phone]            Signed: Ventura Bruns PA-C 09/27/2019, 2:00 PM

## 2019-09-27 NOTE — Progress Notes (Signed)
Physical Therapy Treatment Patient Details Name: Victoria Lyons MRN: [id number] DOB: 05-06-1940 Today's Date: 09/27/2019    History of Present Illness Pt is a 79 year old female s/p Right THA posterior approach.    PT Comments    Pt ambulated in hallway and practiced step.  Pt requiring cues for maintaining precautions.  Reviewed and discussed precautions with pt and daughter.  Pt also performed standing exercises with UE support.  Pt provided with posterior precaution handout and HEP handout.  Pt had no further questions and feels ready to d/c home today.    Follow Up Recommendations  Follow surgeon's recommendation for DC plan and follow-up therapies     Equipment Recommendations  Rolling walker with 5" wheels;3in1 (PT)    Recommendations for Other Services       Precautions / Restrictions Precautions Precautions: Posterior Hip;Fall Precaution Comments: reviewed posterior hip precautions with pt and daughter, provided handout Restrictions Weight Bearing Restrictions: No    Mobility  Bed Mobility Overal bed mobility: Needs Assistance Bed Mobility: Supine to Sit     Supine to sit: Min guard;HOB elevated     General bed mobility comments: pt in recliner on arrival  Transfers Overall transfer level: Needs assistance Equipment used: Rolling walker (2 wheeled) Transfers: Sit to/from Stand Sit to Stand: Min guard         General transfer comment: verbal cues for positioning and precautions  Ambulation/Gait Ambulation/Gait assistance: Min guard Gait Distance (Feet): 200 Feet Assistive device: Rolling walker (2 wheeled) Gait Pattern/deviations: Step-through pattern;Step-to pattern;Antalgic     General Gait Details: verbal cues for sequence, RW, positioning, maintaining hip precautions   Stairs Stairs: Yes Stairs assistance: Min guard Stair Management: Step to pattern;Forwards;With walker Number of Stairs: 1 General stair comments: verbal cues for sequence,  RW positioning, safety; performed twice   Wheelchair Mobility    Modified Rankin (Stroke Patients Only)       Balance                                            Cognition Arousal/Alertness: Awake/alert Behavior During Therapy: WFL for tasks assessed/performed Overall Cognitive Status: Within Functional Limits for tasks assessed                                        Exercises Total Joint Exercises Ankle Circles/Pumps: AROM;Both;10 reps Quad Sets: AROM;10 reps;Both Heel Slides: AAROM;Right;10 reps(all exercises within posterior hip precautions) Hip ABduction/ADduction: AROM;10 reps;Right;Standing Long Arc Quad: AROM;Seated;Right;10 reps Knee Flexion: AROM;Right;10 reps;Standing Marching in Standing: AROM;Right;10 reps;Standing Standing Hip Extension: AROM;Right;10 reps;Standing    General Comments        Pertinent Vitals/Pain Pain Assessment: 0-10 Pain Score: 4  Pain Location: right hip Pain Descriptors / Indicators: Aching;Sore Pain Intervention(s): Repositioned;RN gave pain meds during session;Monitored during session    Allen expects to be discharged to:: Private residence Living Arrangements: Alone Available Help at Discharge: Available 24 hours/day;Family(daughter from [facility] is coming to stay with pt) Type of Home: House Home Access: Stairs to enter Entrance Stairs-Rails: None Home Layout: One level Home Equipment: None      Prior Function Level of Independence: Independent          PT Goals (current goals can now be found in the care plan section)  Acute Rehab PT Goals PT Goal Formulation: With patient Time For Goal Achievement: 10/02/19 Potential to Achieve Goals: Good Progress towards PT goals: Progressing toward goals    Frequency    7X/week      PT Plan Current plan remains appropriate    Co-evaluation              AM-PAC PT "6 Clicks" Mobility   Outcome Measure  Help  needed turning from your back to your side while in a flat bed without using bedrails?: A Little Help needed moving from lying on your back to sitting on the side of a flat bed without using bedrails?: A Little Help needed moving to and from a bed to a chair (including a wheelchair)?: A Little Help needed standing up from a chair using your arms (e.g., wheelchair or bedside chair)?: A Little Help needed to walk in hospital room?: A Little Help needed climbing 3-5 steps with a railing? : A Little 6 Click Score: 18    End of Session Equipment Utilized During Treatment: Gait belt Activity Tolerance: Patient tolerated treatment well Patient left: in chair;with call bell/phone within reach;with chair alarm set;with family/visitor present Nurse Communication: Mobility status PT Visit Diagnosis: Other abnormalities of gait and mobility (R26.89)     Time: 3419-6222 PT Time Calculation (min) (ACUTE ONLY): 25 min  Charges:  $Gait Training: 8-22 mins $Therapeutic Exercise: 8-22 mins                    Thomasene Mohair PT, DPT Acute Rehabilitation Services Pager: [phone] Office: [phone]  Khristi Schiller,KATHrine E 09/27/2019, 3:08 PM

## 2019-09-27 NOTE — Progress Notes (Signed)
Met briefly with pt to confirm she has received her DME (rw and 3n1) from [place] and plan for HEP.  No TOC needs.  Tish Begin, LCSW

## 2019-11-06 ENCOUNTER — Other Ambulatory Visit (HOSPITAL_COMMUNITY): Payer: Self-pay | Admitting: Orthopedic Surgery

## 2019-11-06 ENCOUNTER — Other Ambulatory Visit: Payer: Self-pay

## 2019-11-06 ENCOUNTER — Ambulatory Visit (HOSPITAL_COMMUNITY)
Admission: RE | Admit: 2019-11-06 | Discharge: 2019-11-06 | Disposition: A | Discharge status: Home / Self Care | Payer: Medicare PPO | Source: Ambulatory Visit | Attending: Internal Medicine | Admitting: Internal Medicine

## 2019-11-06 DIAGNOSIS — M79604 Pain in right leg: Secondary | ICD-10-CM | POA: Insufficient documentation

## 2020-07-23 ENCOUNTER — Encounter (HOSPITAL_BASED_OUTPATIENT_CLINIC_OR_DEPARTMENT_OTHER): Payer: Self-pay | Admitting: *Deleted

## 2020-07-23 ENCOUNTER — Emergency Department (HOSPITAL_BASED_OUTPATIENT_CLINIC_OR_DEPARTMENT_OTHER): Payer: Medicare PPO

## 2020-07-23 ENCOUNTER — Other Ambulatory Visit: Payer: Self-pay

## 2020-07-23 ENCOUNTER — Emergency Department (HOSPITAL_BASED_OUTPATIENT_CLINIC_OR_DEPARTMENT_OTHER)
Admission: EM | Admit: 2020-07-23 | Discharge: 2020-07-23 | Disposition: A | Discharge status: Home / Self Care | Payer: Medicare PPO | Attending: Emergency Medicine | Admitting: Emergency Medicine

## 2020-07-23 DIAGNOSIS — R197 Diarrhea, unspecified: Secondary | ICD-10-CM

## 2020-07-23 DIAGNOSIS — E039 Hypothyroidism, unspecified: Secondary | ICD-10-CM | POA: Diagnosis not present

## 2020-07-23 DIAGNOSIS — K4090 Unilateral inguinal hernia, without obstruction or gangrene, not specified as recurrent: Secondary | ICD-10-CM | POA: Insufficient documentation

## 2020-07-23 DIAGNOSIS — Z96641 Presence of right artificial hip joint: Secondary | ICD-10-CM | POA: Insufficient documentation

## 2020-07-23 DIAGNOSIS — Z79899 Other long term (current) drug therapy: Secondary | ICD-10-CM | POA: Diagnosis not present

## 2020-07-23 DIAGNOSIS — Z7982 Long term (current) use of aspirin: Secondary | ICD-10-CM | POA: Diagnosis not present

## 2020-07-23 DIAGNOSIS — I10 Essential (primary) hypertension: Secondary | ICD-10-CM | POA: Diagnosis not present

## 2020-07-23 DIAGNOSIS — R1030 Lower abdominal pain, unspecified: Secondary | ICD-10-CM | POA: Diagnosis present

## 2020-07-23 DIAGNOSIS — Z91040 Latex allergy status: Secondary | ICD-10-CM | POA: Insufficient documentation

## 2020-07-23 LAB — COMPREHENSIVE METABOLIC PANEL
ALT: 12 U/L (ref 0–44)
AST: 19 U/L (ref 15–41)
Albumin: 4.5 g/dL (ref 3.5–5.0)
Alkaline Phosphatase: 87 U/L (ref 38–126)
Anion gap: 9 (ref 5–15)
BUN: 14 mg/dL (ref 8–23)
CO2: 25 mmol/L (ref 22–32)
Calcium: 9.5 mg/dL (ref 8.9–10.3)
Chloride: 105 mmol/L (ref 98–111)
Creatinine, Ser: 0.94 mg/dL (ref 0.44–1.00)
GFR, Estimated: >60 mL/min (ref >60)
Glucose, Bld: 99 mg/dL (ref 70–99)
Potassium: 3.9 mmol/L (ref 3.5–5.1)
Sodium: 139 mmol/L (ref 135–145)
Total Bilirubin: 0.5 mg/dL (ref 0.3–1.2)
Total Protein: 8.3 g/dL — ABNORMAL HIGH (ref 6.5–8.1)

## 2020-07-23 LAB — URINALYSIS, ROUTINE W REFLEX MICROSCOPIC
Bilirubin Urine: NEGATIVE
Glucose, UA: NEGATIVE mg/dL
Hgb urine dipstick: NEGATIVE
Ketones, ur: NEGATIVE mg/dL
Nitrite: NEGATIVE
Protein, ur: NEGATIVE mg/dL
Specific Gravity, Urine: <1.005 — ABNORMAL LOW (ref 1.005–1.030)
pH: 6 (ref 5.0–8.0)

## 2020-07-23 LAB — LIPASE, BLOOD: Lipase: 26 U/L (ref 11–51)

## 2020-07-23 LAB — URINALYSIS, MICROSCOPIC (REFLEX): RBC / HPF: NONE SEEN RBC/hpf (ref 0–5)

## 2020-07-23 LAB — CBC
HCT: 44.9 % (ref 36.0–46.0)
Hemoglobin: 14.4 g/dL (ref 12.0–15.0)
MCH: 26.5 pg (ref 26.0–34.0)
MCHC: 32.1 g/dL (ref 30.0–36.0)
MCV: 82.7 fL (ref 80.0–100.0)
Platelets: 303 10*3/uL (ref 150–400)
RBC: 5.43 MIL/uL — ABNORMAL HIGH (ref 3.87–5.11)
RDW: 15.4 % (ref 11.5–15.5)
WBC: 5.9 10*3/uL (ref 4.0–10.5)
nRBC: 0 % (ref 0.0–0.2)

## 2020-07-23 MED ORDER — IOHEXOL 300 MG/ML  SOLN
100.0000 mL | Freq: Once | INTRAMUSCULAR | Status: AC | PRN
  Administered 2020-07-23: 100 mL via INTRAVENOUS

## 2020-07-23 NOTE — Discharge Instructions (Signed)
1.  You have an inguinal hernia.  This often causes no problems.  Be aware of it as we discussed in the emergency department.  You may review this with your doctor as well. 2.  Sometimes it is difficult to identify the cause of diarrhea.  If you are not having frequent liquidy stools, it may be difficult to collect a stool specimen for testing.  Discuss this with your doctor.  You may need referral to a gastroenterologist. 3.  Try going to a very limited diet and adding food back in.  Start with grilled, lean meats and rice.  First try avoiding dairy products and anything fatty.  If this does not cause any problems for a week, add just 1 or 2 vegetables prepared steamed without additional seasoning except light salt-and-pepper.  Proceed gradually.  If at any point you develop diarrhea, make a note of the foods that seem to have triggered it.

## 2020-07-23 NOTE — ED Triage Notes (Signed)
Emergency Medicine Provider Triage Evaluation Note  Selina Cooley , a 80 y.o. female  was evaluated in triage.  Pt complains of abdominal pain, diarrhea for 3 weeks. March 18 lasted a week and then after that off and on, not every day. Last night all night. Lower abdominal pain. No recent abx, travel. No fever. No urinary symptoms.   Review of Systems  Positive: Abdominal pain, diarrhea  Negative: Fevers, vomiting, bloody stools   Physical Exam  BP (!) 160/74 (BP Location: Left Arm)   Pulse 80   Temp 98.2 F (36.8 C) (Oral)   Resp 20   Ht 5' 6.5" (1.689 m)   Wt 81.1 kg   SpO2 98%   BMI 28.43 kg/m  Gen:   Awake, no distress   HEENT:  Atraumatic  Resp:  Normal effort  Cardiac:  Normal rate  Abd:   Nondistended, nontender MSK:   Moves extremities without difficulty  Neuro:  Speech clear  Medical Decision Making  Medically screening exam initiated at 2:43 PM.  Appropriate orders placed.  Teodora Medici Shovlin was informed that the remainder of the evaluation will be completed by another provider, this initial triage assessment does not replace that evaluation, and the importance of remaining in the ED until their evaluation is complete.  Clinical Impression  Abdominal pain and diarrhea for 3 weeks, worse last night.  No surgical abdomen, no TTP to abdomen.    Farrel Gordon, PA-C 07/23/20 [id number]

## 2020-07-23 NOTE — ED Provider Notes (Signed)
[facility] EMERGENCY DEPARTMENT Provider Note   CSN: [id number] Arrival date & time: 07/23/20  1352     History Chief Complaint  Patient presents with  . Abdominal Pain    Victoria Lyons is a 80 y.o. female.  HPI Patient reports he has had trouble with diarrhea since the first week of March. she ate a premade bag salad.  That week, she reports she had multiple episodes of diarrhea each day.  She was also having some cramping lower abdominal pain.  She did not have fever vomiting.  Symptoms improved.  She then was better for a few weeks.  After that, she now will sporadically get an episode where she starts with a formed stool and then goes on to have several liquidy stools.  She gets some cramping lower abdominal pain while she is having bowel movements.  Now, this comes and goes.  She is sometimes taking Imodium some relief with the Imodium.  No fevers, no chills.  No nausea no vomiting.  No upper abdominal pain.    Past Medical History:  Diagnosis Date  . Cataract   . DDD (degenerative disc disease), lumbar   . Dermatitis   . Glaucoma   . Hyperlipidemia   . Hypertension   . Hypothyroidism   . Multinodular goiter   . 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 Osteopenia   . Seasonal allergies   . Vertigo     Patient Active Problem List   Diagnosis Date Noted  . Osteoarthritis of right hip 09/26/2019  . S/P hip replacement, right 09/26/2019  . Status post hip replacement, right 09/26/2019    Past Surgical History:  Procedure Laterality Date  . ABDOMINAL HYSTERECTOMY    . ANKLE SURGERY Left   . BREAST BIOPSY Right   . COLONOSCOPY    . HAND SURGERY Right   . SHOULDER SURGERY Bilateral   . thyroid nodule removal    . TOTAL HIP ARTHROPLASTY Right 09/26/2019   Procedure: TOTAL HIP ARTHROPLASTY;  Surgeon: Teryl Lucy, MD;  Location: [facility];  Service: Orthopedics;  Laterality: Right;     OB History   No obstetric history on file.     No family  history on file.  Social History   Tobacco Use  . Smoking status: Never Smoker  . Smokeless tobacco: Never Used  Substance Use Topics  . Alcohol use: Never  . Drug use: Never    Home Medications Prior to Admission medications   Medication Sig Start Date End Date Taking? Authorizing Provider  amLODipine (NORVASC) 5 MG tablet Take 5 mg by mouth daily.   Yes [provider]  aspirin EC 325 MG tablet Take 1 tablet (325 mg total) by mouth 2 (two) times daily. 09/26/19   Armida Sans, PA-C  baclofen (LIORESAL) 10 MG tablet Take 1 tablet (10 mg total) by mouth 3 (three) times daily. As needed for muscle spasm 09/26/19   Janine Ores K, PA-C  fluticasone [facility]) 50 MCG/ACT nasal spray Place 1 spray into both nostrils daily as needed for allergies. 07/14/19   [provider]  HYDROcodone-acetaminophen (NORCO) 5-325 MG tablet Take 1-2 tablets by mouth every 6 (six) hours as needed for moderate pain. MAXIMUM TOTAL ACETAMINOPHEN DOSE IS 4000 MG PER DAY 09/26/19   Janine Ores K, PA-C  loratadine (CLARITIN) 10 MG tablet Take 10 mg by mouth daily as needed for allergies.    [provider]  olopatadine (PATANOL) 0.1 %  ophthalmic solution Place 1 drop into both eyes 2 (two) times daily as needed for allergies. 07/14/19   [provider]  ondansetron (ZOFRAN) 4 MG tablet Take 1 tablet (4 mg total) by mouth every 8 (eight) hours as needed for nausea or vomiting. 09/26/19   Janine Ores K, PA-C  sennosides-docusate sodium (SENOKOT-S) 8.6-50 MG tablet Take 2 tablets by mouth daily. 09/26/19   Armida Sans, PA-C    Allergies    Demeclocycline, Latex, Tetanus toxoids, and Tetracyclines & related  Review of Systems   Review of Systems 10 systems reviewed and negative except as per HPI Physical Exam Updated Vital Signs BP (!) 152/84 (BP Location: Left Arm)   Pulse 94   Temp 98.2 F (36.8 C) (Oral)   Resp 14   Ht 5' 6.5" (1.689 m)   Wt 81.1 kg   SpO2 100%   BMI  28.43 kg/m   Physical Exam Constitutional:      Appearance: She is well-developed.  HENT:     Head: Normocephalic and atraumatic.  Eyes:     Extraocular Movements: Extraocular movements intact.     Conjunctiva/sclera: Conjunctivae normal.  Cardiovascular:     Rate and Rhythm: Normal rate and regular rhythm.     Heart sounds: Normal heart sounds.  Pulmonary:     Effort: Pulmonary effort is normal.     Breath sounds: Normal breath sounds.  Abdominal:     General: Bowel sounds are normal. There is no distension.     Palpations: Abdomen is soft.     Tenderness: There is no abdominal tenderness.     Comments: Abdomen is soft nontender.  At this time I cannot identify any inguinal hernia is made mention of on the CT scan.  The inguinal canals are soft without mass or tenderness.  Musculoskeletal:        General: Normal range of motion.     Cervical back: Neck supple.  Skin:    General: Skin is warm and dry.  Neurological:     Mental Status: She is alert and oriented to person, place, and time.     GCS: GCS eye subscore is 4. GCS verbal subscore is 5. GCS motor subscore is 6.     Coordination: Coordination normal.     ED Results / Procedures / Treatments   Labs (all labs ordered are listed, but only abnormal results are displayed) Labs Reviewed  COMPREHENSIVE METABOLIC PANEL - Abnormal; Notable for the following components:      Result Value   Total Protein 8.3 (*)    All other components within normal limits  CBC - Abnormal; Notable for the following components:   RBC 5.43 (*)    All other components within normal limits  URINALYSIS, ROUTINE W REFLEX MICROSCOPIC - Abnormal; Notable for the following components:   Specific Gravity, Urine <1.005 (*)    Leukocytes,Ua TRACE (*)    All other components within normal limits  URINALYSIS, MICROSCOPIC (REFLEX) - Abnormal; Notable for the following components:   Bacteria, UA RARE (*)    All other components within normal limits   GASTROINTESTINAL PANEL BY PCR, STOOL (REPLACES STOOL CULTURE)  C DIFFICILE QUICK SCREEN W PCR REFLEX  LIPASE, BLOOD    EKG None  Radiology CT Abdomen Pelvis W Contrast  Result Date: 07/23/2020 CLINICAL DATA:  Abdominal pain and diarrhea for 3 weeks, pain all last night. Lower abdominal pain. History hypertension, hysterectomy EXAM: CT ABDOMEN AND PELVIS WITH CONTRAST TECHNIQUE: Multidetector CT imaging  of the abdomen and pelvis was performed using the standard protocol following bolus administration of intravenous contrast. Sagittal and coronal MPR images reconstructed from axial data set. CONTRAST:  OMNIPAQUE IOHEXOL 300 MG/ML  SOLN IV COMPARISON:  None. FINDINGS: Lower chest: Minimal bibasilar scarring Hepatobiliary: Gallbladder and liver normal appearance Pancreas: Normal appearance Spleen: Normal appearance Adrenals/Urinary Tract: Adrenal glands, kidneys, visualized ureters, and visualized bladder normal appearance. Portions of the distal ureters and bladder are obscured by beam hardening artifacts in pelvis. Stomach/Bowel: Normal appendix in lateral RIGHT pelvis. Incomplete gastric distension, limiting assessment of proximal gastric wall thickness. Extension of a small bowel loop into a RIGHT inguinal hernia without definite evidence of obstruction. Diverticulosis of descending and sigmoid colon without evidence of diverticulitis. Remaining bowel loops unremarkable. Vascular/Lymphatic: Scattered pelvic phleboliths. Aorta normal caliber. Circumaortic LEFT renal vein. No adenopathy. Reproductive: Uterus surgically absent. Normal size and appearance of LEFT ovary. RIGHT ovary not definitely visualized. Other: Tiny umbilical hernia containing fat. No free air or free fluid. Musculoskeletal: Mild degenerative disc disease changes of the thoracolumbar spine with mild levoconvex scoliosis. IMPRESSION: RIGHT inguinal hernia containing a small bowel loop without definite evidence of obstruction.  Distal colonic diverticulosis without evidence of diverticulitis. Tiny umbilical hernia containing fat. No acute intra-abdominal or intrapelvic abnormalities. Electronically Signed   By: Ulyses Southward M.D.   On: 07/23/2020 17:04    Procedures Procedures   Medications Ordered in ED Medications  iohexol (OMNIPAQUE) 300 MG/ML solution 100 mL (100 mLs Intravenous Contrast Given 07/23/20 1617)    ED Course  I have reviewed the triage vital signs and the nursing notes.  Pertinent labs & imaging results that were available during my care of the patient were reviewed by me and considered in my medical decision making (see chart for details).    MDM Rules/Calculators/A&P                          Patient is clinically well in appearance.  She is having sporadic diarrhea since early March.  CT scan shows diverticulosis but no diverticulitis.  Patient abdomen is nontender.  Labs are otherwise within normal limits.  There is incidental note of an inguinal hernia.  This is not obvious on physical exam.  Patient does not describe symptoms it was suggest she is having any problems with ongoing pain or incarcerated hernia by any means.  she is educated on the nature of an inguinal hernia and return precautions.  Have counseled her to go to a very limited diet and gradually add foods back into determine if her diarrhea is food related.  No clear indication at this time that she has infectious diarrhea.  She may need stool studies and GI follow-up if symptoms persist. Final Clinical Impression(s) / ED Diagnoses Final diagnoses:  Lower abdominal pain  Diarrhea, unspecified type  Unilateral inguinal hernia without obstruction or gangrene, recurrence not specified    Rx / DC Orders ED Discharge Orders    None       Arby Barrette, MD 07/23/20 1743

## 2020-07-23 NOTE — ED Notes (Addendum)
Pt states that she has not had a BM since around 1030 this morning states that she also took an imodium today. Pt made aware of need for stool sample.

## 2020-07-23 NOTE — ED Triage Notes (Signed)
3 weeks after eating a salad she has had abdominal pain. Diarrhea. No vomiting.

## 2021-02-19 ENCOUNTER — Other Ambulatory Visit: Payer: Self-pay

## 2021-02-19 ENCOUNTER — Emergency Department (HOSPITAL_BASED_OUTPATIENT_CLINIC_OR_DEPARTMENT_OTHER): Payer: Medicare PPO

## 2021-02-19 ENCOUNTER — Encounter (HOSPITAL_BASED_OUTPATIENT_CLINIC_OR_DEPARTMENT_OTHER): Payer: Self-pay

## 2021-02-19 ENCOUNTER — Emergency Department (HOSPITAL_BASED_OUTPATIENT_CLINIC_OR_DEPARTMENT_OTHER)
Admission: EM | Admit: 2021-02-19 | Discharge: 2021-02-19 | Disposition: A | Discharge status: Home / Self Care | Payer: Medicare PPO | Attending: Emergency Medicine | Admitting: Emergency Medicine

## 2021-02-19 DIAGNOSIS — R059 Cough, unspecified: Secondary | ICD-10-CM | POA: Diagnosis present

## 2021-02-19 DIAGNOSIS — Z91040 Latex allergy status: Secondary | ICD-10-CM | POA: Insufficient documentation

## 2021-02-19 DIAGNOSIS — I10 Essential (primary) hypertension: Secondary | ICD-10-CM | POA: Diagnosis not present

## 2021-02-19 DIAGNOSIS — J101 Influenza due to other identified influenza virus with other respiratory manifestations: Secondary | ICD-10-CM

## 2021-02-19 DIAGNOSIS — Z7982 Long term (current) use of aspirin: Secondary | ICD-10-CM | POA: Diagnosis not present

## 2021-02-19 DIAGNOSIS — E039 Hypothyroidism, unspecified: Secondary | ICD-10-CM | POA: Insufficient documentation

## 2021-02-19 DIAGNOSIS — Z79899 Other long term (current) drug therapy: Secondary | ICD-10-CM | POA: Insufficient documentation

## 2021-02-19 DIAGNOSIS — R509 Fever, unspecified: Secondary | ICD-10-CM | POA: Diagnosis not present

## 2021-02-19 DIAGNOSIS — Z20822 Contact with and (suspected) exposure to covid-19: Secondary | ICD-10-CM | POA: Insufficient documentation

## 2021-02-19 DIAGNOSIS — Z96641 Presence of right artificial hip joint: Secondary | ICD-10-CM | POA: Diagnosis not present

## 2021-02-19 LAB — BASIC METABOLIC PANEL
Anion gap: 9 (ref 5–15)
BUN: 23 mg/dL (ref 8–23)
CO2: 25 mmol/L (ref 22–32)
Calcium: 8.8 mg/dL — ABNORMAL LOW (ref 8.9–10.3)
Chloride: 103 mmol/L (ref 98–111)
Creatinine, Ser: 1.2 mg/dL — ABNORMAL HIGH (ref 0.44–1.00)
GFR, Estimated: 46 mL/min — ABNORMAL LOW (ref >60)
Glucose, Bld: 100 mg/dL — ABNORMAL HIGH (ref 70–99)
Potassium: 3.5 mmol/L (ref 3.5–5.1)
Sodium: 137 mmol/L (ref 135–145)

## 2021-02-19 LAB — CBC WITH DIFFERENTIAL/PLATELET
Abs Immature Granulocytes: 0.02 10*3/uL (ref 0.00–0.07)
Basophils Absolute: 0 10*3/uL (ref 0.0–0.1)
Basophils Relative: 1 %
Eosinophils Absolute: 0.1 10*3/uL (ref 0.0–0.5)
Eosinophils Relative: 1 %
HCT: 41.1 % (ref 36.0–46.0)
Hemoglobin: 13.5 g/dL (ref 12.0–15.0)
Immature Granulocytes: 0 %
Lymphocytes Relative: 43 %
Lymphs Abs: 2.4 10*3/uL (ref 0.7–4.0)
MCH: 27.6 pg (ref 26.0–34.0)
MCHC: 32.8 g/dL (ref 30.0–36.0)
MCV: 83.9 fL (ref 80.0–100.0)
Monocytes Absolute: 0.6 10*3/uL (ref 0.1–1.0)
Monocytes Relative: 11 %
Neutro Abs: 2.5 10*3/uL (ref 1.7–7.7)
Neutrophils Relative %: 44 %
Platelets: 247 10*3/uL (ref 150–400)
RBC: 4.9 MIL/uL (ref 3.87–5.11)
RDW: 15.5 % (ref 11.5–15.5)
WBC: 5.6 10*3/uL (ref 4.0–10.5)
nRBC: 0 % (ref 0.0–0.2)

## 2021-02-19 LAB — RESP PANEL BY RT-PCR (FLU A&B, COVID) ARPGX2
Influenza A by PCR: POSITIVE — AB
Influenza B by PCR: NEGATIVE
SARS Coronavirus 2 by RT PCR: NEGATIVE

## 2021-02-19 MED ORDER — ACETAMINOPHEN 325 MG PO TABS
650.0000 mg | ORAL_TABLET | Freq: Once | ORAL | Status: AC
  Administered 2021-02-19: 650 mg via ORAL
  Filled 2021-02-19: qty 2

## 2021-02-19 MED ORDER — SODIUM CHLORIDE 0.9 % IV BOLUS
1000.0000 mL | Freq: Once | INTRAVENOUS | Status: AC
  Administered 2021-02-19: 1000 mL via INTRAVENOUS

## 2021-02-19 NOTE — ED Triage Notes (Signed)
Pt c/o flu like sx x 4 days-NAD-steady gait 

## 2021-02-19 NOTE — Discharge Instructions (Signed)
You have influenza.  You are expected to run fevers for several days.  Take Tylenol and Motrin for fever  See your doctor for follow-up  Return to ER if you have worse cough, trouble breathing

## 2021-02-19 NOTE — ED Provider Notes (Signed)
[facility] EMERGENCY DEPARTMENT Provider Note   CSN: [id number] Arrival date & time: 02/19/21  1605     History Chief Complaint  Patient presents with   Cough    Victoria Lyons is a 80 y.o. female hx of HTN, HL, here presenting with cough.  Patient has flulike syndrome for the last 4 days.  Patient states that she has nonproductive cough.  She states that she has some chills and subjective fevers.  Denies any abdominal pain or vomiting or urinary symptoms. She did not get her flu shot this year  The history is provided by the patient.      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   Osteopenia    Seasonal allergies    Vertigo     Patient Active Problem List   Diagnosis Date Noted   Osteoarthritis of right hip 09/26/2019   S/P hip replacement, right 09/26/2019   Status post hip replacement, right 09/26/2019    Past Surgical History:  Procedure Laterality Date   ABDOMINAL HYSTERECTOMY     ANKLE SURGERY Left    BREAST BIOPSY Right    COLONOSCOPY     HAND SURGERY Right    SHOULDER SURGERY Bilateral    thyroid nodule removal     TOTAL HIP ARTHROPLASTY Right 09/26/2019   Procedure: TOTAL HIP ARTHROPLASTY;  Surgeon: Teryl Lucy, MD;  Location: [facility];  Service: Orthopedics;  Laterality: Right;     OB History   No obstetric history on file.     No family history on file.  Social History   Tobacco Use   Smoking status: Never   Smokeless tobacco: Never  Substance Use Topics   Alcohol use: Never   Drug use: Never    Home Medications Prior to Admission medications   Medication Sig Start Date End Date Taking? Authorizing Provider  amLODipine (NORVASC) 5 MG tablet Take 5 mg by mouth daily.    [provider]  aspirin EC 325 MG tablet Take  1 tablet (325 mg total) by mouth 2 (two) times daily. 09/26/19   Armida Sans, PA-C  baclofen (LIORESAL) 10 MG tablet Take 1 tablet (10 mg total) by mouth 3 (three) times daily. As needed for muscle spasm 09/26/19   Janine Ores K, PA-C  fluticasone [facility]) 50 MCG/ACT nasal spray Place 1 spray into both nostrils daily as needed for allergies. 07/14/19   [provider]  HYDROcodone-acetaminophen (NORCO) 5-325 MG tablet Take 1-2 tablets by mouth every 6 (six) hours as needed for moderate pain. MAXIMUM TOTAL ACETAMINOPHEN DOSE IS 4000 MG PER DAY 09/26/19   Janine Ores K, PA-C  loratadine (CLARITIN) 10 MG tablet Take 10 mg by mouth daily as needed for allergies.    [provider]  olopatadine (PATANOL) 0.1 % ophthalmic solution Place 1 drop into both eyes 2 (two) times daily as needed for allergies. 07/14/19   [provider]  ondansetron (ZOFRAN) 4 MG tablet Take 1 tablet (4 mg total) by mouth every 8 (eight) hours as needed for nausea or vomiting. 09/26/19   Janine Ores K, PA-C  sennosides-docusate sodium (SENOKOT-S) 8.6-50 MG tablet Take 2 tablets by mouth daily. 09/26/19   Armida Sans,  PA-C    Allergies    Demeclocycline, Latex, Tetanus toxoids, and Tetracyclines & related  Review of Systems   Review of Systems  Respiratory:  Positive for cough.   All other systems reviewed and are negative.  Physical Exam Updated Vital Signs BP (!) 164/84 (BP Location: Left Arm)   Pulse (!) 104   Temp 99.5 F (37.5 C) (Oral)   Resp 18   SpO2 97%   Physical Exam Vitals and nursing note reviewed.  Constitutional:      Appearance: Normal appearance.     Comments: Slightly dehydrated   HENT:     Head: Normocephalic.     Nose: Nose normal.     Mouth/Throat:     Mouth: Mucous membranes are dry.  Eyes:     Extraocular Movements: Extraocular movements intact.     Pupils: Pupils are equal, round, and reactive to light.  Cardiovascular:     Rate and Rhythm: Normal rate  and regular rhythm.     Pulses: Normal pulses.     Heart sounds: Normal heart sounds.  Pulmonary:     Effort: Pulmonary effort is normal.     Comments: Diminished bilaterally  Abdominal:     General: Abdomen is flat.     Palpations: Abdomen is soft.  Musculoskeletal:        General: Normal range of motion.     Cervical back: Normal range of motion and neck supple.  Skin:    General: Skin is warm.     Capillary Refill: Capillary refill takes less than 2 seconds.  Neurological:     General: No focal deficit present.     Mental Status: She is alert and oriented to person, place, and time.  Psychiatric:        Mood and Affect: Mood normal.        Behavior: Behavior normal.    ED Results / Procedures / Treatments   Labs (all labs ordered are listed, but only abnormal results are displayed) Labs Reviewed  RESP PANEL BY RT-PCR (FLU A&B, COVID) ARPGX2 - Abnormal; Notable for the following components:      Result Value   Influenza A by PCR POSITIVE (*)    All other components within normal limits  CBC WITH DIFFERENTIAL/PLATELET  BASIC METABOLIC PANEL    EKG None  Radiology DG Chest Port 1 View  Result Date: 02/19/2021 CLINICAL DATA:  Cough and shortness of breath.  Flu like symptoms. EXAM: PORTABLE CHEST 1 VIEW COMPARISON:  08/10/2017 FINDINGS: The cardiomediastinal contours are normal. Minor left lung base scarring, stable in appearance. Pulmonary vasculature is normal. No consolidation, pleural effusion, or pneumothorax. No acute osseous abnormalities are seen. IMPRESSION: No acute chest finding. Minor left lung base scarring. Electronically Signed   By: Narda Rutherford M.D.   On: 02/19/2021 20:34    Procedures Procedures   Medications Ordered in ED Medications  acetaminophen (TYLENOL) tablet 650 mg (650 mg Oral Given 02/19/21 2031)  sodium chloride 0.9 % bolus 1,000 mL (1,000 mLs Intravenous New Bag/Given 02/19/21 2041)    ED Course  I have reviewed the triage vital  signs and the nursing notes.  Pertinent labs & imaging results that were available during my care of the patient were reviewed by me and considered in my medical decision making (see chart for details).    MDM Rules/Calculators/A&P  Victoria Lyons is a 80 y.o. female here presenting with cough and fever.  Patient has been coughing for 4 days.  Will swab for COVID and RSV and flu.  Will hydrate patient as patient is tachycardic  9:29 PM Patient is fluid positive.  Labs unremarkable.  Chest x-ray did not show an infiltrate.  Heart rate improved with IV fluids.  She is outside the window for Tamiflu.  Stable for discharge   Final Clinical Impression(s) / ED Diagnoses Final diagnoses:  None    Rx / DC Orders ED Discharge Orders     None        Charlynne Pander, MD 02/19/21 2130

## 2021-05-18 ENCOUNTER — Emergency Department (HOSPITAL_BASED_OUTPATIENT_CLINIC_OR_DEPARTMENT_OTHER): Payer: Medicare PPO

## 2021-05-18 ENCOUNTER — Emergency Department (HOSPITAL_BASED_OUTPATIENT_CLINIC_OR_DEPARTMENT_OTHER)
Admission: EM | Admit: 2021-05-18 | Discharge: 2021-05-18 | Disposition: A | Discharge status: Home / Self Care | Payer: Medicare PPO | Attending: Emergency Medicine | Admitting: Emergency Medicine

## 2021-05-18 ENCOUNTER — Encounter (HOSPITAL_BASED_OUTPATIENT_CLINIC_OR_DEPARTMENT_OTHER): Payer: Self-pay | Admitting: Emergency Medicine

## 2021-05-18 ENCOUNTER — Other Ambulatory Visit: Payer: Self-pay

## 2021-05-18 DIAGNOSIS — R0789 Other chest pain: Secondary | ICD-10-CM | POA: Diagnosis present

## 2021-05-18 DIAGNOSIS — Z79899 Other long term (current) drug therapy: Secondary | ICD-10-CM | POA: Insufficient documentation

## 2021-05-18 DIAGNOSIS — R110 Nausea: Secondary | ICD-10-CM | POA: Diagnosis not present

## 2021-05-18 DIAGNOSIS — Z7982 Long term (current) use of aspirin: Secondary | ICD-10-CM | POA: Diagnosis not present

## 2021-05-18 DIAGNOSIS — Z91040 Latex allergy status: Secondary | ICD-10-CM | POA: Insufficient documentation

## 2021-05-18 DIAGNOSIS — F419 Anxiety disorder, unspecified: Secondary | ICD-10-CM | POA: Insufficient documentation

## 2021-05-18 DIAGNOSIS — R9431 Abnormal electrocardiogram [ECG] [EKG]: Secondary | ICD-10-CM | POA: Insufficient documentation

## 2021-05-18 DIAGNOSIS — T50905A Adverse effect of unspecified drugs, medicaments and biological substances, initial encounter: Secondary | ICD-10-CM

## 2021-05-18 LAB — CBC WITH DIFFERENTIAL/PLATELET
Abs Immature Granulocytes: 0.01 10*3/uL (ref 0.00–0.07)
Basophils Absolute: 0.1 10*3/uL (ref 0.0–0.1)
Basophils Relative: 1 %
Eosinophils Absolute: 0.2 10*3/uL (ref 0.0–0.5)
Eosinophils Relative: 2 %
HCT: 40.3 % (ref 36.0–46.0)
Hemoglobin: 13.2 g/dL (ref 12.0–15.0)
Immature Granulocytes: 0 %
Lymphocytes Relative: 49 %
Lymphs Abs: 3.2 10*3/uL (ref 0.7–4.0)
MCH: 27.4 pg (ref 26.0–34.0)
MCHC: 32.8 g/dL (ref 30.0–36.0)
MCV: 83.6 fL (ref 80.0–100.0)
Monocytes Absolute: 0.5 10*3/uL (ref 0.1–1.0)
Monocytes Relative: 8 %
Neutro Abs: 2.6 10*3/uL (ref 1.7–7.7)
Neutrophils Relative %: 40 %
Platelets: 291 10*3/uL (ref 150–400)
RBC: 4.82 MIL/uL (ref 3.87–5.11)
RDW: 15.1 % (ref 11.5–15.5)
WBC: 6.5 10*3/uL (ref 4.0–10.5)
nRBC: 0 % (ref 0.0–0.2)

## 2021-05-18 LAB — BASIC METABOLIC PANEL
Anion gap: 8 (ref 5–15)
BUN: 19 mg/dL (ref 8–23)
CO2: 26 mmol/L (ref 22–32)
Calcium: 9.1 mg/dL (ref 8.9–10.3)
Chloride: 106 mmol/L (ref 98–111)
Creatinine, Ser: 1.05 mg/dL — ABNORMAL HIGH (ref 0.44–1.00)
GFR, Estimated: 54 mL/min — ABNORMAL LOW (ref >60)
Glucose, Bld: 106 mg/dL — ABNORMAL HIGH (ref 70–99)
Potassium: 3.6 mmol/L (ref 3.5–5.1)
Sodium: 140 mmol/L (ref 135–145)

## 2021-05-18 LAB — TROPONIN I (HIGH SENSITIVITY): Troponin I (High Sensitivity): 3 ng/L (ref <18)

## 2021-05-18 MED ORDER — ALUM & MAG HYDROXIDE-SIMETH 200-200-20 MG/5ML PO SUSP
30.0000 mL | Freq: Once | ORAL | Status: AC
  Administered 2021-05-18: 30 mL via ORAL
  Filled 2021-05-18: qty 30

## 2021-05-18 MED ORDER — SODIUM BICARBONATE 8.4 % IV SOLN
50.0000 meq | Freq: Once | INTRAVENOUS | Status: AC
  Administered 2021-05-18: 50 meq via INTRAVENOUS
  Filled 2021-05-18: qty 50

## 2021-05-18 NOTE — ED Provider Notes (Signed)
[facility] EMERGENCY DEPARTMENT Provider Note   CSN: [id number] Arrival date & time: 05/18/21  0038     History  Chief Complaint  Patient presents with   Chest Pain    Victoria Lyons is a 81 y.o. female.  The history is provided by the patient.  Chest Pain Pain location:  L chest Pain quality: sharp   Pain radiates to:  Does not radiate Pain severity:  Mild Onset quality:  Sudden Duration: lasted 1 second and went away. Timing:  Rare Progression:  Resolved Chronicity:  New Context: at rest   Context comment:  After taking antibiotics and feeling queasy with them Relieved by:  Nothing Worsened by:  Nothing Ineffective treatments:  None tried Associated symptoms: anxiety and nausea   Associated symptoms: no abdominal pain, no anorexia, no cough, no dizziness, no fever, no headache, no lower extremity edema, no palpitations, no shortness of breath, no syncope, no vomiting and no weakness   Risk factors: no diabetes mellitus and not female       Home Medications Prior to Admission medications   Medication Sig Start Date End Date Taking? Authorizing Provider  amLODipine (NORVASC) 5 MG tablet Take 5 mg by mouth daily.    [provider]  aspirin EC 325 MG tablet Take 1 tablet (325 mg total) by mouth 2 (two) times daily. 09/26/19   Armida Sans, PA-C  baclofen (LIORESAL) 10 MG tablet Take 1 tablet (10 mg total) by mouth 3 (three) times daily. As needed for muscle spasm 09/26/19   Janine Ores K, PA-C  fluticasone [facility]) 50 MCG/ACT nasal spray Place 1 spray into both nostrils daily as needed for allergies. 07/14/19   [provider]  HYDROcodone-acetaminophen (NORCO) 5-325 MG tablet Take 1-2 tablets by mouth every 6 (six) hours as needed for moderate pain. MAXIMUM TOTAL ACETAMINOPHEN DOSE IS 4000 MG PER DAY 09/26/19   Janine Ores K, PA-C  loratadine (CLARITIN) 10 MG tablet Take 10 mg by mouth daily as needed for allergies.    [provider]  olopatadine (PATANOL) 0.1 % ophthalmic solution Place 1 drop into both eyes 2 (two) times daily as needed for allergies. 07/14/19   [provider]  ondansetron (ZOFRAN) 4 MG tablet Take 1 tablet (4 mg total) by mouth every 8 (eight) hours as needed for nausea or vomiting. 09/26/19   Janine Ores K, PA-C  sennosides-docusate sodium (SENOKOT-S) 8.6-50 MG tablet Take 2 tablets by mouth daily. 09/26/19   Armida Sans, PA-C      Allergies    Demeclocycline, Latex, Tetanus toxoids, and Tetracyclines & related    Review of Systems   Review of Systems  Constitutional:  Negative for fever.  HENT:  Negative for facial swelling.   Eyes:  Negative for redness.  Respiratory:  Negative for cough and shortness of breath.   Cardiovascular:  Positive for chest pain. Negative for palpitations and syncope.  Gastrointestinal:  Positive for nausea. Negative for abdominal pain, anorexia and vomiting.  Neurological:  Negative for dizziness, weakness and headaches.  All other systems reviewed and are negative.  Physical Exam Updated Vital Signs BP (!) 173/86 (BP Location: Right Arm)    Pulse 80    Temp 98 F (36.7 C) (Oral)    Resp 12    Ht 5' 6.5" (1.689 m)    Wt 79.4 kg    SpO2 98%    BMI 27.82 kg/m  Physical Exam  ED Results / Procedures / Treatments  Labs (all labs ordered are listed, but only abnormal results are displayed) Labs Reviewed  BASIC METABOLIC PANEL - Abnormal; Notable for the following components:      Result Value   Glucose, Bld 106 (*)    Creatinine, Ser 1.05 (*)    GFR, Estimated 54 (*)    All other components within normal limits  CBC WITH DIFFERENTIAL/PLATELET  TROPONIN I (HIGH SENSITIVITY)    EKG EKG Interpretation  Date/Time:  Sunday May 18 2021 01:28:46 EST Ventricular Rate:  80 PR Interval:  163 QRS Duration: 74 QT Interval:  366 QTC Calculation: 423 R Axis:   -51 Text Interpretation: Sinus rhythm Inferior infarct, old Probable anterior infarct,  old Confirmed by Nicanor AlconPalumbo, Ashlay Altieri ([id number]) on 05/18/2021 1:29:42 AM  Radiology DG Chest Portable 1 View  Result Date: 05/18/2021 CLINICAL DATA:  Chest pain EXAM: PORTABLE CHEST 1 VIEW COMPARISON:  02/19/2021 FINDINGS: The heart size and mediastinal contours are within normal limits. Both lungs are clear. The visualized skeletal structures are unremarkable. IMPRESSION: No active disease. Electronically Signed   By: Alcide CleverMark  Lukens M.D.   On: 05/18/2021 01:25    Procedures Procedures    Medications Ordered in ED Medications  alum & mag hydroxide-simeth (MAALOX/MYLANTA) 200-200-20 MG/5ML suspension 30 mL (30 mLs Oral Given 05/18/21 0122)  sodium bicarbonate injection 50 mEq (50 mEq Intravenous Given 05/18/21 0122)    ED Course/ Medical Decision Making/ A&P                           Medical Decision Making Has been taking cipro and augmentin for UTi and then felt queasy and noted BP was elevated and then felt anxious and BP became more elevated and had 1 second of chest pain.  Then resolved.    Amount and/or Complexity of Data Reviewed Labs: ordered.    Details: negative troponin, normal electrolytes Radiology: ordered.    Details: CXR negative by me ECG/medicine tests: ordered.    Details: 1st EKG prologed QT and QTC, bicarb 1 amp given with normalization of the QT and QTc, consistent with medications that problong QT.  Family informed to discontinue cipro  Risk OTC drugs. Prescription drug management. Risk Details: 1 second of pain is not cardiac in etiology, regardless one troponin was ordered given BP elevation and is normal.  Electrolytes are normal.  Patient is resting comfortably and is stable for discharge.  Patient is on too many medication for too long, there is no indication for 10 days of augmentin without pneumonia.  Discontinue this as it may upset stomach.  Follow up with your PMD from recheck of EKG.  Referral to cardiology made.     Final Clinical Impression(s) / ED  Diagnoses Final diagnoses:  Prolonged Q-T interval on ECG   Return for intractable cough, coughing up blood, fevers > 100.4 unrelieved by medication, shortness of breath, intractable vomiting, chest pain, shortness of breath, weakness, numbness, changes in speech, facial asymmetry, abdominal pain, passing out, Inability to tolerate liquids or food, cough, altered mental status or any concerns. No signs of systemic illness or infection. The patient is nontoxic-appearing on exam and vital signs are within normal limits.  I have reviewed the triage vital signs and the nursing notes. Pertinent labs & imaging results that were available during my care of the patient were reviewed by me and considered in my medical decision making (see chart for details). After history, exam, and medical workup I feel the patient has  been appropriately medically screened and is safe for discharge home. Pertinent diagnoses were discussed with the patient. Patient was given return precautions.       Trequan Marsolek, MD 05/18/21 [id number]

## 2021-05-18 NOTE — ED Triage Notes (Signed)
Reports two episodes of pinching chest pain that lasted for a second earlier tonight when trying to go to bed.  Also reports some nausea.  Seen by ems at home.

## 2021-05-18 NOTE — Discharge Instructions (Addendum)
Stop taking emetrol, ciprofloxacin and Augmentin

## 2021-05-21 ENCOUNTER — Ambulatory Visit: Payer: Medicare PPO | Admitting: Cardiovascular Disease

## 2021-05-21 ENCOUNTER — Encounter: Payer: Self-pay | Admitting: Cardiovascular Disease

## 2021-05-21 ENCOUNTER — Other Ambulatory Visit: Payer: Self-pay

## 2021-05-21 VITALS — BP 120/80 | HR 93 | Ht 66.5 in | Wt 172.8 lb

## 2021-05-21 DIAGNOSIS — R9431 Abnormal electrocardiogram [ECG] [EKG]: Secondary | ICD-10-CM

## 2021-05-21 DIAGNOSIS — I10 Essential (primary) hypertension: Secondary | ICD-10-CM

## 2021-05-21 DIAGNOSIS — I444 Left anterior fascicular block: Secondary | ICD-10-CM

## 2021-05-21 NOTE — Patient Instructions (Signed)
Medication Instructions:  No changes *If you need a refill on your cardiac medications before your next appointment, please call your pharmacy*   Lab Work: None ordered If you have labs (blood work) drawn today and your tests are completely normal, you will receive your results only by: MyChart Message (if you have MyChart) OR A paper copy in the mail If you have any lab test that is abnormal or we need to change your treatment, we will call you to review the results.   Testing/Procedures: None ordered   Follow-Up: At [facility], you and your health needs are our priority.  As part of our continuing mission to provide you with exceptional heart care, we have created designated Provider Care Teams.  These Care Teams include your primary Cardiologist (physician) and Advanced Practice Providers (APPs -  Physician Assistants and Nurse Practitioners) who all work together to provide you with the care you need, when you need it.  We recommend signing up for the patient portal called "MyChart".  Sign up information is provided on this After Visit Summary.  MyChart is used to connect with patients for Virtual Visits (Telemedicine).  Patients are able to view lab/test results, encounter notes, upcoming appointments, etc.  Non-urgent messages can be sent to your provider as well.   To learn more about what you can do with MyChart, go to [email or web address].    Your next appointment:   Follow up as needed with Dr. Croitoru  

## 2021-05-21 NOTE — Progress Notes (Signed)
Cardiology Office Note:    Date:  05/21/2021   ID:  Victoria Lyons, DOB [birth year], MRN [id number]  PCP:  Curt Jews, PA-C   [facility] Providers Cardiologist:  None     Referring MD: Curt Jews, PA-C   Chief Complaint  Patient presents with   Abnormal ECG  Victoria Lyons is a 81 y.o. female who is being seen today for the evaluation of prolonged QT interval at the request of Curt Jews, PA-C.   History of Present Illness:    Victoria Lyons is a 81 y.o. female with a hx of generally good health, treated hypertension (amlodipine monotherapy), degenerative disc disease of the lumbar spine, seasonal allergies, recently had an episode of tachycardia/hypertension leading to emergency room visit.  She had been taking Augmentin for a urinary tract infection and after a few days felt queasy and uneasy.  Developed slowly worsening palpitations, anxiety and tremor, elevated blood pressure to roughly 200/100, eventually going to emergency room.  Her initial ECG showed findings concerning for a prolonged QT interval, but this improved while she was in the emergency room.  Cardiac enzymes were normal.  Electrolytes were normal.  She had very brief pinching pains in her anterior chest.  She did not have shortness of breath but felt that she was breathing fast.  She denied palpitations, dizziness, vertigo, syncope.  She did not have any pleuritic discomfort.  Prior to the acute illness she was feeling quite well without any complaints of exertional angina or dyspnea, lower extremity edema or claudication.  She is already feeling much better after stopping her antibiotics.  She continues to have some abdominal unease and loose stools, but not frank diarrhea.  ECG performed in the office today shows normal sinus rhythm with left anterior fascicular block and a completely normal QTC at just over 400 ms.  There are no ischemic repolarization abnormalities on any of her ECG tracings.  Of  her ECGs was interpreted as showing inferior infarct and old anterior infarct, but these are artifactual changes related to left anterior fascicular block and secondary poor R wave progression.  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   amLODipine (NORVASC) 5 MG tablet Take 1 tablet by mouth daily.   fluticasone (FLONASE) 50 MCG/ACT nasal spray Place 1 spray into both nostrils daily as needed for allergies.   loratadine (CLARITIN) 10 MG tablet Take 10 mg by mouth daily as needed for allergies.     Allergies:   Ciprofloxacin, Demeclocycline, Latex, Tetanus toxoids, and  Tetracyclines & related   Social History   Socioeconomic History   Marital status: Divorced    Spouse name: Not on file   Number of children: Not on file   Years of education: Not on file   Highest education level: Not on file  Occupational History   Not on file  Tobacco Use   Smoking status: Never   Smokeless tobacco: Never  Substance and Sexual Activity   Alcohol use: Never   Drug use: Never   Sexual activity: Not on file  Other Topics Concern   Not on file  Social History Narrative   Not on file   Social Determinants of Health   Financial Resource  Strain: Not on file  Food Insecurity: Not on file  Transportation Needs: Not on file  Physical Activity: Not on file  Stress: Not on file  Social Connections: Not on file     Family History: The patient's family history significant for myocardial infarction in her dad at age 72.  Her mother lived to be 41.  ROS:   Please see the history of present illness.     All other systems reviewed and are negative.  EKGs/Labs/Other Studies Reviewed:    The following studies were reviewed today: Electrocardiograms and notes and labs from ER visit.  EKG:  EKG is ordered today.  The ekg ordered today demonstrates sinus rhythm, left anterior fascicular block, normal QTC 407 ms, no ischemic repolarization abnormalities.  Recent Labs: 07/23/2020: ALT 12 05/18/2021: BUN 19; Creatinine, Ser 1.05; Hemoglobin 13.2; Platelets 291; Potassium 3.6; Sodium 140  Recent Lipid Panel No results found for: CHOL, TRIG, HDL, CHOLHDL, VLDL, LDLCALC, LDLDIRECT   Risk Assessment/Calculations:           Physical Exam:    VS:  BP 120/80 (BP Location: Left Arm, Patient Position: Sitting, Cuff Size: Large)    Pulse 93    Ht 5' 6.5" (1.689 m)    Wt 172 lb 12.8 oz (78.4 kg)    SpO2 97%    BMI 27.47 kg/m     Wt Readings from Last 3 Encounters:  05/21/21 172 lb 12.8 oz (78.4 kg)  05/18/21 175 lb (79.4 kg)  07/23/20 178 lb 12.7 oz (81.1 kg)     GEN: Appears healthy, lean, well nourished, well developed in no acute distress HEENT: Normal NECK: No JVD; No carotid bruits LYMPHATICS: No lymphadenopathy CARDIAC: RRR, no murmurs, rubs, gallops RESPIRATORY:  Clear to auscultation without rales, wheezing or rhonchi  ABDOMEN: Soft, non-tender, non-distended MUSCULOSKELETAL:  No edema; No deformity  SKIN: Warm and dry NEUROLOGIC:  Alert and oriented x 3 PSYCHIATRIC:  Normal affect   ASSESSMENT:    1. Abnormal EKG   2. LAFB (left anterior fascicular block)   3. Essential hypertension    PLAN:    In order of  problems listed above:  Prolonged QT interval: The automatic measurement on the ECG in the emergency room was exaggerated.  The T waves are rather flat in its difficult to measure the QT interval precisely but I think the QTC was closer to 480 ms.  Completely normal QTC on today's tracing.  Probably a good idea to avoid QT prolonging medications in the future, but she has never had significant rhythm abnormalities by 78 and is unlikely that she has a true long QT syndrome. LAFB: No signs or symptoms of high-grade AV block.  Asked her to call if she should develop sudden presyncope/syncope or severe bradycardia. HTN: Controlled.  Medication Adjustments/Labs and Tests Ordered: Current medicines are reviewed at length with the patient today.  Concerns regarding medicines are outlined above.  Orders Placed This Encounter  Procedures   EKG 12-Lead   No orders of the defined types were placed in this encounter.   Patient Instructions  Medication Instructions:  No changes *If you need a refill on your cardiac medications before your next appointment, please call your pharmacy*   Lab Work: None ordered If you have labs (blood work) drawn today and your tests are completely normal, you will receive your results only by: [place] (if you have MyChart) OR A paper copy in the mail If you have any lab test that is abnormal or we need to change your treatment, we will call you to review the results.   Testing/Procedures: None ordered   Follow-Up: At [facility], you and your health needs are our priority.  As part of our continuing mission to provide you with exceptional heart care, we have created designated Provider Care Teams.  These Care Teams include your primary Cardiologist (physician) and Advanced Practice Providers (APPs -  Physician Assistants and Nurse Practitioners) who all work together to provide you with the care you need, when you need it.  We recommend  signing up for the patient portal called "MyChart".  Sign up information is provided on this After Visit Summary.  MyChart is used to connect with patients for Virtual Visits (Telemedicine).  Patients are able to view lab/test results, encounter notes, upcoming appointments, etc.  Non-urgent messages can be sent to your provider as well.   To learn more about what you can do with MyChart, go to [email or web address].    Your next appointment:   Follow up as needed with Dr. Sallyanne Kuster    Signed, Sanda Klein, MD  05/21/2021 2:10 PM    [place]

## 2022-01-14 ENCOUNTER — Emergency Department (HOSPITAL_BASED_OUTPATIENT_CLINIC_OR_DEPARTMENT_OTHER): Payer: Medicare PPO

## 2022-01-14 ENCOUNTER — Other Ambulatory Visit: Payer: Self-pay

## 2022-01-14 ENCOUNTER — Encounter (HOSPITAL_BASED_OUTPATIENT_CLINIC_OR_DEPARTMENT_OTHER): Payer: Self-pay

## 2022-01-14 ENCOUNTER — Emergency Department (HOSPITAL_BASED_OUTPATIENT_CLINIC_OR_DEPARTMENT_OTHER)
Admission: EM | Admit: 2022-01-14 | Discharge: 2022-01-14 | Disposition: A | Discharge status: Home / Self Care | Payer: Medicare PPO | Attending: Emergency Medicine | Admitting: Emergency Medicine

## 2022-01-14 DIAGNOSIS — R002 Palpitations: Secondary | ICD-10-CM | POA: Diagnosis present

## 2022-01-14 DIAGNOSIS — Z91040 Latex allergy status: Secondary | ICD-10-CM | POA: Insufficient documentation

## 2022-01-14 DIAGNOSIS — I10 Essential (primary) hypertension: Secondary | ICD-10-CM | POA: Insufficient documentation

## 2022-01-14 DIAGNOSIS — Z79899 Other long term (current) drug therapy: Secondary | ICD-10-CM | POA: Insufficient documentation

## 2022-01-14 DIAGNOSIS — E876 Hypokalemia: Secondary | ICD-10-CM | POA: Diagnosis not present

## 2022-01-14 DIAGNOSIS — N390 Urinary tract infection, site not specified: Secondary | ICD-10-CM | POA: Diagnosis not present

## 2022-01-14 LAB — URINALYSIS, ROUTINE W REFLEX MICROSCOPIC
Bilirubin Urine: NEGATIVE
Glucose, UA: NEGATIVE mg/dL
Ketones, ur: NEGATIVE mg/dL
Nitrite: POSITIVE — AB
Protein, ur: NEGATIVE mg/dL
Specific Gravity, Urine: 1.01 (ref 1.005–1.030)
pH: 6.5 (ref 5.0–8.0)

## 2022-01-14 LAB — COMPREHENSIVE METABOLIC PANEL
ALT: 12 U/L (ref 0–44)
AST: 18 U/L (ref 15–41)
Albumin: 3.9 g/dL (ref 3.5–5.0)
Alkaline Phosphatase: 88 U/L (ref 38–126)
Anion gap: 5 (ref 5–15)
BUN: 19 mg/dL (ref 8–23)
CO2: 28 mmol/L (ref 22–32)
Calcium: 8.8 mg/dL — ABNORMAL LOW (ref 8.9–10.3)
Chloride: 109 mmol/L (ref 98–111)
Creatinine, Ser: 0.96 mg/dL (ref 0.44–1.00)
GFR, Estimated: >60 mL/min (ref >60)
Glucose, Bld: 105 mg/dL — ABNORMAL HIGH (ref 70–99)
Potassium: 3.3 mmol/L — ABNORMAL LOW (ref 3.5–5.1)
Sodium: 142 mmol/L (ref 135–145)
Total Bilirubin: 0.6 mg/dL (ref 0.3–1.2)
Total Protein: 7.2 g/dL (ref 6.5–8.1)

## 2022-01-14 LAB — CBC WITH DIFFERENTIAL/PLATELET
Abs Immature Granulocytes: 0.01 10*3/uL (ref 0.00–0.07)
Basophils Absolute: 0.1 10*3/uL (ref 0.0–0.1)
Basophils Relative: 1 %
Eosinophils Absolute: 0.1 10*3/uL (ref 0.0–0.5)
Eosinophils Relative: 2 %
HCT: 40.1 % (ref 36.0–46.0)
Hemoglobin: 13 g/dL (ref 12.0–15.0)
Immature Granulocytes: 0 %
Lymphocytes Relative: 47 %
Lymphs Abs: 3.2 10*3/uL (ref 0.7–4.0)
MCH: 27.4 pg (ref 26.0–34.0)
MCHC: 32.4 g/dL (ref 30.0–36.0)
MCV: 84.4 fL (ref 80.0–100.0)
Monocytes Absolute: 0.6 10*3/uL (ref 0.1–1.0)
Monocytes Relative: 8 %
Neutro Abs: 2.9 10*3/uL (ref 1.7–7.7)
Neutrophils Relative %: 42 %
Platelets: 286 10*3/uL (ref 150–400)
RBC: 4.75 MIL/uL (ref 3.87–5.11)
RDW: 14.6 % (ref 11.5–15.5)
WBC: 6.9 10*3/uL (ref 4.0–10.5)
nRBC: 0 % (ref 0.0–0.2)

## 2022-01-14 LAB — URINALYSIS, MICROSCOPIC (REFLEX)

## 2022-01-14 MED ORDER — POTASSIUM CHLORIDE CRYS ER 20 MEQ PO TBCR
20.0000 meq | EXTENDED_RELEASE_TABLET | Freq: Once | ORAL | Status: AC
  Administered 2022-01-14: 20 meq via ORAL
  Filled 2022-01-14: qty 1

## 2022-01-14 MED ORDER — CEPHALEXIN 500 MG PO CAPS: 500.0000 mg | ORAL_CAPSULE | Freq: Two times a day (BID) | ORAL | 0 refills | Status: DC

## 2022-01-14 MED ORDER — CEPHALEXIN 250 MG PO CAPS
500.0000 mg | ORAL_CAPSULE | Freq: Once | ORAL | Status: AC
  Administered 2022-01-14: 500 mg via ORAL
  Filled 2022-01-14: qty 2

## 2022-01-14 NOTE — ED Triage Notes (Signed)
Patient reports having palpitations and feeling lightheaded x 45 minutes. Has h/x of HTN , reports being rx amlodipine 5mg  daily. Denies chest pain or shortness of breath.

## 2022-01-14 NOTE — ED Provider Notes (Signed)
[facility] EMERGENCY DEPARTMENT Provider Note   CSN: [id number] Arrival date & time: 01/14/22  0051     History  Chief Complaint  Patient presents with   Palpitations    Victoria Lyons is a 81 y.o. female.  The history is provided by the patient, a relative and medical records.  Palpitations Victoria Lyons is a 81 y.o. female who presents to the Emergency Department complaining of palpitations.  She presents to the emergency department accompanied by her son for evaluation of palpitations that started at midnight while she was lying in bed.  She felt like her heart was beating fast and she felt lightheaded.  She checked her blood pressure and it was 950 systolic.  She had a heart rate of 103 at that time.  She reports no associated symptoms.  No fever, cough, nausea, vomiting, diarrhea, dysuria, chest pain.  No recent illnesses.  She had a similar episode in the past when she was taking Augmentin for a urinary tract infection.  Hx/o allergies, eczema, HTN.     Home Medications Prior to Admission medications   Medication Sig Start Date End Date Taking? Authorizing Provider  cephALEXin (KEFLEX) 500 MG capsule Take 1 capsule (500 mg total) by mouth 2 (two) times daily. 01/14/22  Yes Quintella Reichert, MD  amLODipine (NORVASC) 5 MG tablet Take 5 mg by mouth daily.    [provider]  amLODipine (NORVASC) 5 MG tablet Take 1 tablet by mouth daily. 07/31/20   [provider]  amoxicillin (AMOXIL) 500 MG capsule For Dental procedures only Patient not taking: Reported on 05/21/2021 04/07/21   [provider]  fluticasone (FLONASE) 50 MCG/ACT nasal spray Place 1 spray into both nostrils daily as needed for allergies. 07/14/19   [provider]  loratadine (CLARITIN) 10 MG tablet Take 10 mg by mouth daily as needed for allergies.    [provider]  olopatadine (PATANOL) 0.1 % ophthalmic solution Place 1 drop into both eyes 2 (two) times  daily as needed for allergies. Patient not taking: Reported on 05/21/2021 07/14/19   [provider]  sennosides-docusate sodium (SENOKOT-S) 8.6-50 MG tablet Take 2 tablets by mouth daily. Patient not taking: Reported on 05/21/2021 09/26/19   Ventura Bruns, PA-C      Allergies    Augmentin [amoxicillin-pot clavulanate], Ciprofloxacin, Demeclocycline, Latex, Tetanus toxoids, and Tetracyclines & related    Review of Systems   Review of Systems  Cardiovascular:  Positive for palpitations.  All other systems reviewed and are negative.   Physical Exam Updated Vital Signs BP (!) 170/90   Pulse (!) 101   Temp 98.7 F (37.1 C) (Oral)   Resp 19   SpO2 96%  Physical Exam Vitals and nursing note reviewed.  Constitutional:      Appearance: She is well-developed.  HENT:     Head: Normocephalic and atraumatic.  Cardiovascular:     Rate and Rhythm: Normal rate and regular rhythm.     Heart sounds: No murmur heard. Pulmonary:     Effort: Pulmonary effort is normal. No respiratory distress.     Breath sounds: Normal breath sounds.  Abdominal:     Palpations: Abdomen is soft.     Tenderness: There is no abdominal tenderness. There is no guarding or rebound.  Musculoskeletal:        General: No swelling or tenderness.  Skin:    General: Skin is warm and dry.  Neurological:     Mental Status: She  is alert and oriented to person, place, and time.  Psychiatric:        Behavior: Behavior normal.     ED Results / Procedures / Treatments   Labs (all labs ordered are listed, but only abnormal results are displayed) Labs Reviewed  COMPREHENSIVE METABOLIC PANEL - Abnormal; Notable for the following components:      Result Value   Potassium 3.3 (*)    Glucose, Bld 105 (*)    Calcium 8.8 (*)    All other components within normal limits  URINALYSIS, ROUTINE W REFLEX MICROSCOPIC - Abnormal; Notable for the following components:   APPearance HAZY (*)    Hgb urine dipstick TRACE (*)     Nitrite POSITIVE (*)    Leukocytes,Ua MODERATE (*)    All other components within normal limits  URINALYSIS, MICROSCOPIC (REFLEX) - Abnormal; Notable for the following components:   Bacteria, UA MANY (*)    All other components within normal limits  URINE CULTURE  CBC WITH DIFFERENTIAL/PLATELET    EKG EKG Interpretation  Date/Time:  Wednesday January 14 2022 01:21:46 EDT Ventricular Rate:  81 PR Interval:  150 QRS Duration: 86 QT Interval:  392 QTC Calculation: 455 R Axis:   -59 Text Interpretation: Sinus rhythm Consider right atrial enlargement Inferior infarct, old Consider anterior infarct Confirmed by Tilden Fossa [phone]) on 01/14/2022 1:42:41 AM  Radiology DG Chest 2 View  Result Date: 01/14/2022 CLINICAL DATA:  Palpitations and lightheadedness EXAM: CHEST - 2 VIEW COMPARISON:  Radiograph 05/18/2021 FINDINGS: No focal consolidation, pleural effusion, or pneumothorax. Bibasilar atelectasis. Normal cardiomediastinal silhouette. No acute osseous abnormality. IMPRESSION: No active cardiopulmonary disease. Electronically Signed   By: Minerva Fester M.D.   On: 01/14/2022 02:08    Procedures Procedures    Medications Ordered in ED Medications  potassium chloride SA (KLOR-CON M) CR tablet 20 mEq (20 mEq Oral Given 01/14/22 0307)  cephALEXin (KEFLEX) capsule 500 mg (500 mg Oral Given 01/14/22 0307)    ED Course/ Medical Decision Making/ A&P                           Medical Decision Making Amount and/or Complexity of Data Reviewed Labs: ordered. Radiology: ordered.  Risk Prescription drug management.   Patient with history of hypertension here for evaluation of palpitations.  Symptoms are essentially resolved at time of ED assessment.  EKG is without acute arrhythmia or ischemic changes.  CMP with mild hypokalemia.  CBC is unremarkable.  UA was ordered given patient's similar symptoms in the past with UTI.  UA is concerning for UTI.  We will send a culture.   Discussed with patient findings of studies.  She was treated with oral antibiotics for UTI as well as potassium replacement.  Feel at this point she is stable for discharge home with outpatient follow-up and return precautions.  Current clinical picture is not consistent with ACS, PE, life-threatening arrhythmia, sepsis.        Final Clinical Impression(s) / ED Diagnoses Final diagnoses:  Palpitations  Acute UTI  Hypokalemia    Rx / DC Orders ED Discharge Orders          Ordered    cephALEXin (KEFLEX) 500 MG capsule  2 times daily        01/14/22 0245              Tilden Fossa, MD 01/14/22 [phone]

## 2022-01-16 LAB — URINE CULTURE: Culture: >=100000 — AB

## 2022-01-17 ENCOUNTER — Telehealth (HOSPITAL_BASED_OUTPATIENT_CLINIC_OR_DEPARTMENT_OTHER): Payer: Self-pay | Admitting: *Deleted

## 2022-01-17 NOTE — Telephone Encounter (Signed)
Post ED Visit - Positive Culture Follow-up  Culture report reviewed by antimicrobial stewardship pharmacist: [facility] Team []  Elenor Quinones, Pharm.D. []  Heide Guile, Pharm.D., BCPS AQ-ID []  Parks Neptune, Pharm.D., BCPS []  Alycia Rossetti, Pharm.D., BCPS []  [place], Pharm.D., BCPS, AAHIVP []  Legrand Como, Pharm.D., BCPS, AAHIVP []  Salome Arnt, PharmD, BCPS []  Johnnette Gourd, PharmD, BCPS []  Hughes Better, PharmD, BCPS []  Leeroy Cha, PharmD []  Laqueta Linden, PharmD, BCPS [x]  Luisa Hart, PharmD  [facility] Team []  Leodis Sias, PharmD []  Lindell Spar, PharmD []  Royetta Asal, PharmD []  Graylin Shiver, Rph []  Rema Fendt) Glennon Mac, PharmD []  Arlyn Dunning, PharmD []  Netta Cedars, PharmD []  Dia Sitter, PharmD []  Leone Haven, PharmD []  Gretta Arab, PharmD []  Theodis Shove, PharmD []  Peggyann Juba, PharmD []  Reuel Boom, PharmD   Positive urine culture Treated with Cephalexin, organism sensitive to the same and no further patient follow-up is required at this time.  Rosie Fate 01/17/2022, 2:56 PM

## 2022-04-28 IMAGING — DX DG HIP (WITH OR WITHOUT PELVIS) 1V PORT*R*
2 series · 2 of 2 positions shown · non-contrast
Comparison: None.

CLINICAL DATA: Right hip replacement

EXAM:
DG HIP (WITH OR WITHOUT PELVIS) 1V PORT RIGHT

[hip lat (1 of 2)]
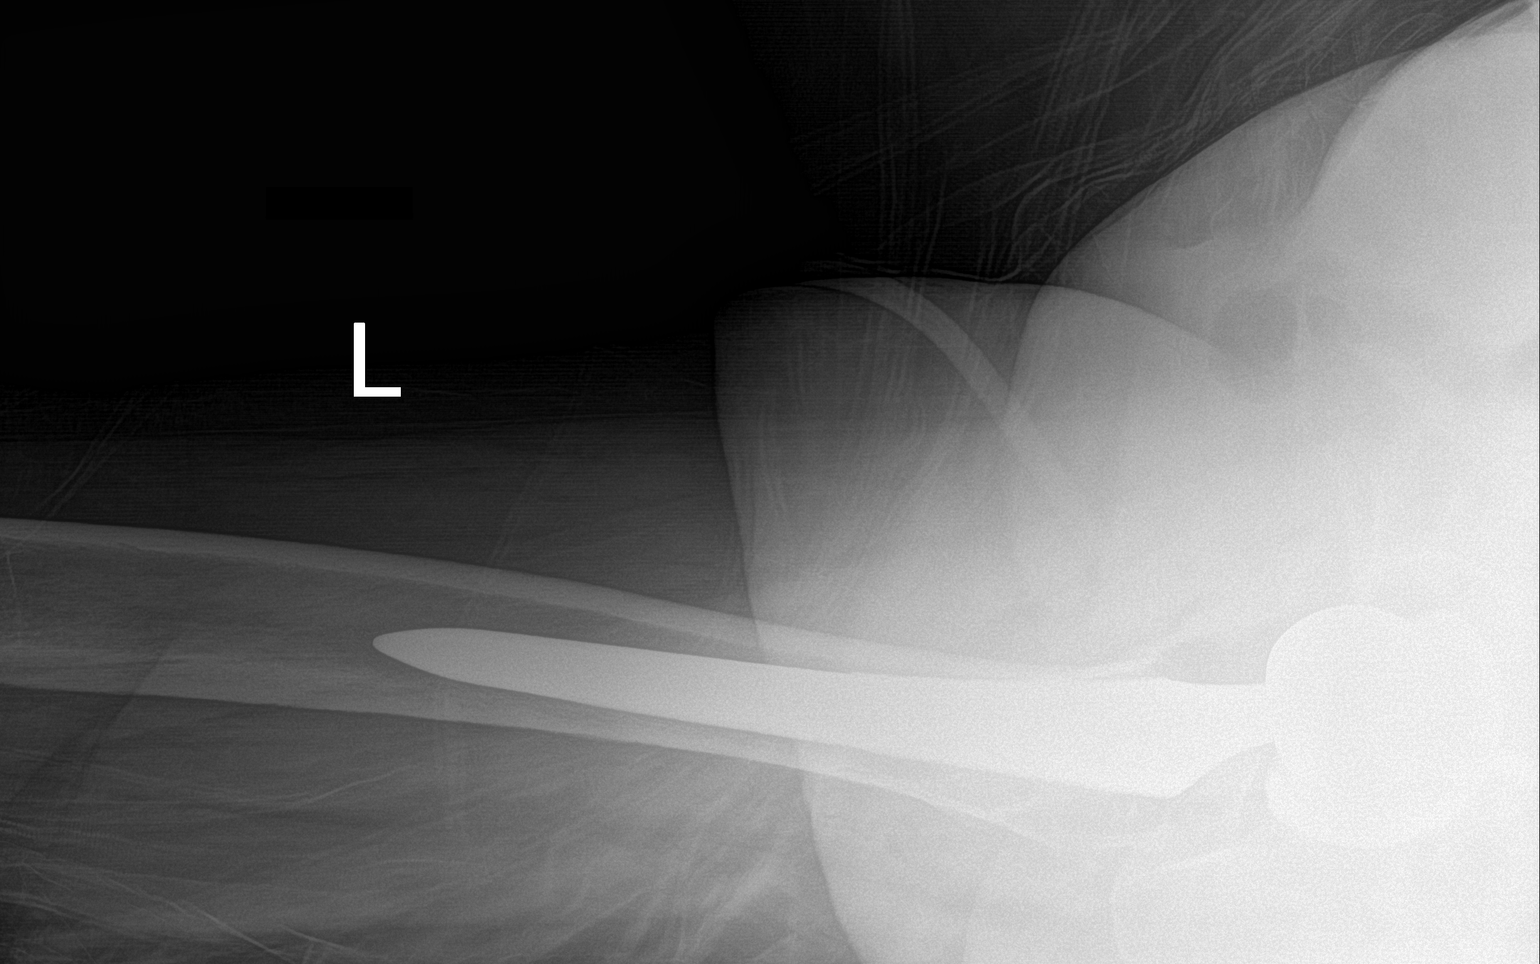

[hip lat (2 of 2)]
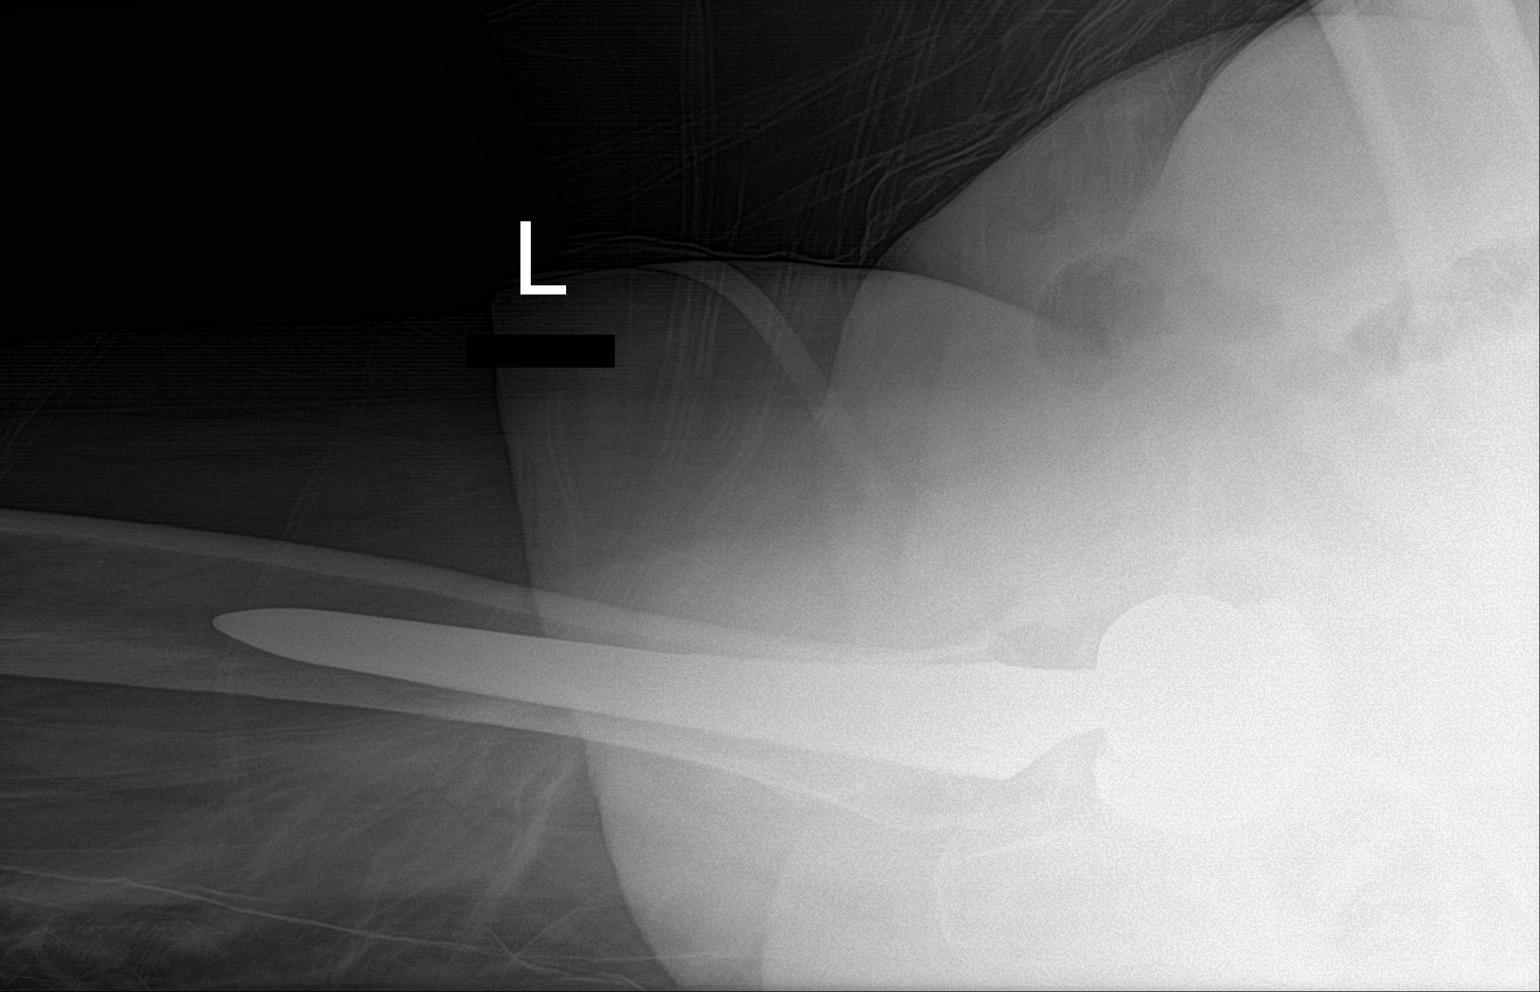

[2 of 2 positions shown; findings below may reference images not displayed]

FINDINGS: Changes of right hip replacement. Normal cross-table lateral
appearance. No hardware complicating feature.
IMPRESSION: Right hip replacement.  No visible complicating feature.

## 2022-05-02 ENCOUNTER — Emergency Department (HOSPITAL_BASED_OUTPATIENT_CLINIC_OR_DEPARTMENT_OTHER)
Admission: EM | Admit: 2022-05-02 | Discharge: 2022-05-02 | Disposition: A | Discharge status: Home / Self Care | Payer: Medicare PPO | Attending: Emergency Medicine | Admitting: Emergency Medicine

## 2022-05-02 ENCOUNTER — Other Ambulatory Visit: Payer: Self-pay

## 2022-05-02 ENCOUNTER — Encounter (HOSPITAL_BASED_OUTPATIENT_CLINIC_OR_DEPARTMENT_OTHER): Payer: Self-pay | Admitting: Emergency Medicine

## 2022-05-02 ENCOUNTER — Emergency Department (HOSPITAL_BASED_OUTPATIENT_CLINIC_OR_DEPARTMENT_OTHER): Payer: Medicare PPO

## 2022-05-02 DIAGNOSIS — I10 Essential (primary) hypertension: Secondary | ICD-10-CM | POA: Diagnosis not present

## 2022-05-02 DIAGNOSIS — R079 Chest pain, unspecified: Secondary | ICD-10-CM | POA: Insufficient documentation

## 2022-05-02 DIAGNOSIS — Z79899 Other long term (current) drug therapy: Secondary | ICD-10-CM | POA: Insufficient documentation

## 2022-05-02 DIAGNOSIS — R002 Palpitations: Secondary | ICD-10-CM | POA: Insufficient documentation

## 2022-05-02 DIAGNOSIS — Z91040 Latex allergy status: Secondary | ICD-10-CM | POA: Insufficient documentation

## 2022-05-02 DIAGNOSIS — R000 Tachycardia, unspecified: Secondary | ICD-10-CM | POA: Insufficient documentation

## 2022-05-02 LAB — CBC
HCT: 39.8 % (ref 36.0–46.0)
Hemoglobin: 12.6 g/dL (ref 12.0–15.0)
MCH: 27 pg (ref 26.0–34.0)
MCHC: 31.7 g/dL (ref 30.0–36.0)
MCV: 85.2 fL (ref 80.0–100.0)
Platelets: 262 10*3/uL (ref 150–400)
RBC: 4.67 MIL/uL (ref 3.87–5.11)
RDW: 15.1 % (ref 11.5–15.5)
WBC: 5.9 10*3/uL (ref 4.0–10.5)
nRBC: 0 % (ref 0.0–0.2)

## 2022-05-02 LAB — TROPONIN I (HIGH SENSITIVITY)
Troponin I (High Sensitivity): <2 ng/L (ref <18)
Troponin I (High Sensitivity): 2 ng/L (ref <18)

## 2022-05-02 LAB — BASIC METABOLIC PANEL
Anion gap: 6 (ref 5–15)
BUN: 17 mg/dL (ref 8–23)
CO2: 25 mmol/L (ref 22–32)
Calcium: 8.7 mg/dL — ABNORMAL LOW (ref 8.9–10.3)
Chloride: 107 mmol/L (ref 98–111)
Creatinine, Ser: 1 mg/dL (ref 0.44–1.00)
GFR, Estimated: 57 mL/min — ABNORMAL LOW (ref >60)
Glucose, Bld: 107 mg/dL — ABNORMAL HIGH (ref 70–99)
Potassium: 3.5 mmol/L (ref 3.5–5.1)
Sodium: 138 mmol/L (ref 135–145)

## 2022-05-02 NOTE — ED Triage Notes (Signed)
Pt states tonight around 2330 she felt her heart fluttering and she could hear her heart beat in her ears  Pt states she took her blood pressure at home and it was elevated  Pt states she waited to see if it would stop but it has not so she came in  Pt denies any chest pain

## 2022-05-02 NOTE — ED Provider Notes (Signed)
[place] [facility] EMERGENCY DEPARTMENT Provider Note   CSN: [id number] Arrival date & time: 05/02/22  0131     History  Chief Complaint  Patient presents with   Palpitations    Victoria Lyons is a 82 y.o. female.  The history is provided by the patient and medical records.  Palpitations Victoria Lyons is a 82 y.o. female who presents to the Emergency Department complaining of palpitations.  She presents to the emergency department for evaluation of a rapid heartbeat and thumping in her chest that started around 11:30 in the evening.  Symptoms lasted for about an hour and a half.  There was no significant chest pain or nausea.  She has experienced similar episodes in the past but none that lasted this long. She has been under increased stress due to her sister losing her husband in November.  Symptoms were resolved after presentation to the emergency department.  Constant drainage in her throat-this is an ongoing issue. No fever. No sob. No leg swelling.   Hx/o HTN. No hx/o DVT/PE.  No recent surgeries.      Home Medications Prior to Admission medications   Medication Sig Start Date End Date Taking? Authorizing Provider  amLODipine (NORVASC) 5 MG tablet Take 5 mg by mouth daily.    [provider]  amLODipine (NORVASC) 5 MG tablet Take 1 tablet by mouth daily. 07/31/20   [provider]  amoxicillin (AMOXIL) 500 MG capsule For Dental procedures only Patient not taking: Reported on 05/21/2021 04/07/21   [provider]  cephALEXin (KEFLEX) 500 MG capsule Take 1 capsule (500 mg total) by mouth 2 (two) times daily. 01/14/22   Quintella Reichert, MD  fluticasone [facility]) 50 MCG/ACT nasal spray Place 1 spray into both nostrils daily as needed for allergies. 07/14/19   [provider]  loratadine (CLARITIN) 10 MG tablet Take 10 mg by mouth daily as needed for allergies.    [provider]  olopatadine (PATANOL) 0.1 % ophthalmic solution  Place 1 drop into both eyes 2 (two) times daily as needed for allergies. Patient not taking: Reported on 05/21/2021 07/14/19   [provider]  sennosides-docusate sodium (SENOKOT-S) 8.6-50 MG tablet Take 2 tablets by mouth daily. Patient not taking: Reported on 05/21/2021 09/26/19   Ventura Bruns, PA-C      Allergies    Augmentin [amoxicillin-pot clavulanate], Ciprofloxacin, Demeclocycline, Latex, Tetanus toxoids, and Tetracyclines & related    Review of Systems   Review of Systems  Cardiovascular:  Positive for palpitations.  All other systems reviewed and are negative.   Physical Exam Updated Vital Signs BP (!) 146/78   Pulse 73   Temp 98 F (36.7 C) (Oral)   Resp 16   Ht 5' 6.5" (1.689 m)   Wt 74.8 kg   SpO2 97%   BMI 26.23 kg/m  Physical Exam Vitals and nursing note reviewed.  Constitutional:      Appearance: She is well-developed.  HENT:     Head: Normocephalic and atraumatic.  Cardiovascular:     Rate and Rhythm: Normal rate and regular rhythm.     Heart sounds: No murmur heard. Pulmonary:     Effort: Pulmonary effort is normal. No respiratory distress.     Breath sounds: Normal breath sounds.  Abdominal:     Palpations: Abdomen is soft.     Tenderness: There is no abdominal tenderness. There is no guarding or rebound.  Musculoskeletal:        General: No  swelling or tenderness.  Skin:    General: Skin is warm and dry.  Neurological:     Mental Status: She is alert and oriented to person, place, and time.  Psychiatric:        Behavior: Behavior normal.     ED Results / Procedures / Treatments   Labs (all labs ordered are listed, but only abnormal results are displayed) Labs Reviewed  BASIC METABOLIC PANEL - Abnormal; Notable for the following components:      Result Value   Glucose, Bld 107 (*)    Calcium 8.7 (*)    GFR, Estimated 57 (*)    All other components within normal limits  CBC  TROPONIN I (HIGH SENSITIVITY)  TROPONIN I (HIGH  SENSITIVITY)    EKG EKG Interpretation  Date/Time:  Saturday May 02 2022 01:39:12 EST Ventricular Rate:  89 PR Interval:  150 QRS Duration: 68 QT Interval:  386 QTC Calculation: 469 R Axis:   -66 Text Interpretation: Normal sinus rhythm Left anterior fascicular block ST & T wave abnormality, consider lateral ischemia Abnormal ECG Confirmed by Quintella Reichert [phone]) on 05/02/2022 1:52:51 AM  Radiology DG Chest 2 View  Result Date: 05/02/2022 CLINICAL DATA:  Cardiac palpitations EXAM: CHEST - 2 VIEW COMPARISON:  01/14/2022 FINDINGS: The heart size and mediastinal contours are within normal limits. Both lungs are clear. The visualized skeletal structures are unremarkable. IMPRESSION: No active cardiopulmonary disease. Electronically Signed   By: Inez Catalina M.D.   On: 05/02/2022 02:37    Procedures Procedures    Medications Ordered in ED Medications - No data to display  ED Course/ Medical Decision Making/ A&P                             Medical Decision Making Amount and/or Complexity of Data Reviewed Labs: ordered. Radiology: ordered.   Patient here for evaluation of palpitations with a pounding and rapid heartbeat at home.  She states she could hear her heartbeat in her ears as well.  Blood pressures were elevated at home prior to ED arrival.  Patient's palpitations and sensation of rapid heartbeat rapidly improved at time of ED arrival.  EKG with a sinus rhythm.  Initial EKG with artifact, repeat EKG without acute ischemic changes.  Troponins are negative x 2.  Chest x-ray is negative for acute infiltrate or pulmonary edema-images personally reviewed and interpreted, agree with radiologist interpretation.  Current clinical picture is not consistent with ACS, PE, acute CHF, pneumonia.  No significant anemia or electrolyte abnormality.  Discussed with patient home care for palpitations-this may be secondary to elevated blood pressures at home versus anxiety versus underlying  arrhythmia that was not identified at time of ED evaluation.  Feel at this point that she is stable for discharge home with PCP versus cardiology follow-up and return precautions.  Blood pressure did improve in the emergency department without intervention.  Would not change her blood pressure medications at this time.       Final Clinical Impression(s) / ED Diagnoses Final diagnoses:  Palpitations    Rx / DC Orders ED Discharge Orders     None         Quintella Reichert, MD 05/02/22 [phone]

## 2023-08-25 ENCOUNTER — Emergency Department (HOSPITAL_BASED_OUTPATIENT_CLINIC_OR_DEPARTMENT_OTHER)
Admission: EM | Admit: 2023-08-25 | Discharge: 2023-08-25 | Disposition: A | Discharge status: Home / Self Care | Attending: Emergency Medicine | Admitting: Emergency Medicine

## 2023-08-25 ENCOUNTER — Other Ambulatory Visit: Payer: Self-pay

## 2023-08-25 ENCOUNTER — Encounter (HOSPITAL_BASED_OUTPATIENT_CLINIC_OR_DEPARTMENT_OTHER): Payer: Self-pay | Admitting: Emergency Medicine

## 2023-08-25 DIAGNOSIS — E039 Hypothyroidism, unspecified: Secondary | ICD-10-CM | POA: Insufficient documentation

## 2023-08-25 DIAGNOSIS — R42 Dizziness and giddiness: Secondary | ICD-10-CM | POA: Diagnosis present

## 2023-08-25 DIAGNOSIS — I10 Essential (primary) hypertension: Secondary | ICD-10-CM | POA: Insufficient documentation

## 2023-08-25 DIAGNOSIS — R531 Weakness: Secondary | ICD-10-CM | POA: Insufficient documentation

## 2023-08-25 LAB — URINALYSIS, ROUTINE W REFLEX MICROSCOPIC
Bilirubin Urine: NEGATIVE
Glucose, UA: NEGATIVE mg/dL
Hgb urine dipstick: NEGATIVE
Ketones, ur: NEGATIVE mg/dL
Nitrite: NEGATIVE
Protein, ur: NEGATIVE mg/dL
Specific Gravity, Urine: 1.01 (ref 1.005–1.030)
pH: 7 (ref 5.0–8.0)

## 2023-08-25 LAB — COMPREHENSIVE METABOLIC PANEL WITH GFR
ALT: 9 U/L (ref 0–44)
AST: 16 U/L (ref 15–41)
Albumin: 3.7 g/dL (ref 3.5–5.0)
Alkaline Phosphatase: 63 U/L (ref 38–126)
Anion gap: 9 (ref 5–15)
BUN: 16 mg/dL (ref 8–23)
CO2: 26 mmol/L (ref 22–32)
Calcium: 9 mg/dL (ref 8.9–10.3)
Chloride: 106 mmol/L (ref 98–111)
Creatinine, Ser: 1.05 mg/dL — ABNORMAL HIGH (ref 0.44–1.00)
GFR, Estimated: 53 mL/min — ABNORMAL LOW (ref >60)
Glucose, Bld: 121 mg/dL — ABNORMAL HIGH (ref 70–99)
Potassium: 4.1 mmol/L (ref 3.5–5.1)
Sodium: 141 mmol/L (ref 135–145)
Total Bilirubin: 0.2 mg/dL (ref 0.0–1.2)
Total Protein: 6.3 g/dL — ABNORMAL LOW (ref 6.5–8.1)

## 2023-08-25 LAB — CBC WITH DIFFERENTIAL/PLATELET
Abs Immature Granulocytes: 0.01 10*3/uL (ref 0.00–0.07)
Basophils Absolute: 0 10*3/uL (ref 0.0–0.1)
Basophils Relative: 1 %
Eosinophils Absolute: 0.1 10*3/uL (ref 0.0–0.5)
Eosinophils Relative: 2 %
HCT: 34.8 % — ABNORMAL LOW (ref 36.0–46.0)
Hemoglobin: 11.2 g/dL — ABNORMAL LOW (ref 12.0–15.0)
Immature Granulocytes: 0 %
Lymphocytes Relative: 48 %
Lymphs Abs: 2 10*3/uL (ref 0.7–4.0)
MCH: 27.6 pg (ref 26.0–34.0)
MCHC: 32.2 g/dL (ref 30.0–36.0)
MCV: 85.7 fL (ref 80.0–100.0)
Monocytes Absolute: 0.3 10*3/uL (ref 0.1–1.0)
Monocytes Relative: 7 %
Neutro Abs: 1.7 10*3/uL (ref 1.7–7.7)
Neutrophils Relative %: 42 %
Platelets: 220 10*3/uL (ref 150–400)
RBC: 4.06 MIL/uL (ref 3.87–5.11)
RDW: 14.6 % (ref 11.5–15.5)
WBC: 4 10*3/uL (ref 4.0–10.5)
nRBC: 0 % (ref 0.0–0.2)

## 2023-08-25 LAB — URINALYSIS, MICROSCOPIC (REFLEX)

## 2023-08-25 LAB — TROPONIN T, HIGH SENSITIVITY: Troponin T High Sensitivity: <15 ng/L (ref <19)

## 2023-08-25 MED ORDER — SODIUM CHLORIDE 0.9 % IV BOLUS
500.0000 mL | Freq: Once | INTRAVENOUS | Status: AC
  Administered 2023-08-25: 500 mL via INTRAVENOUS

## 2023-08-25 MED ORDER — CEPHALEXIN 500 MG PO CAPS: 500.0000 mg | ORAL_CAPSULE | Freq: Two times a day (BID) | ORAL | 0 refills | Status: AC

## 2023-08-25 NOTE — ED Triage Notes (Signed)
 Pt reports she was given the wrong kind of amlodipine  and has been taking it every day x 7d; sts she has insomnia, urinary retention and low BP

## 2023-08-25 NOTE — ED Provider Notes (Signed)
 Emergency Department Provider Note   I have reviewed the triage vital signs and the nursing notes.   HISTORY  Chief Complaint Medication Reaction   HPI Victoria Lyons is a 83 y.o. female with past history of hypertension, hyperlipidemia, and DDD presents to the emergency department with lightheadedness and some urinary hesitancy along with low blood pressures at home.  She takes amlodipine  5 mg daily and has been doing so for a [place] time.  She went to the pharmacy and got the same medication but the generic brand was different than what she normally takes.  After taking this medication for the past week she has noticed increased insomnia and low BP.  No fevers.  No chest pain or shortness of breath.  No vomiting or diarrhea.  Her appetite has been fair.  She went to her pharmacy and they were able to get her old brand of generic amlodipine  which she has started taking yesterday and this morning but symptoms continue.  No urinary issues since yesterday.  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 Positive weakness.  Cardiovascular: Denies chest pain. Respiratory: Denies shortness of breath. Gastrointestinal: No abdominal pain.  No nausea, no vomiting. Genitourinary: Negative for dysuria. Positive urinary hesitancy (yesterday).  Musculoskeletal: Negative for back pain. Skin: Negative for rash. Neurological: Negative for headaches, focal weakness or numbness.  ____________________________________________   PHYSICAL EXAM:  VITAL SIGNS: ED Triage Vitals  Encounter Vitals Group     BP 08/25/23 1520 (!) 98/55     Pulse Rate 08/25/23 1520 63     Resp 08/25/23 1520 18      Temp 08/25/23 1520 97.7 F (36.5 C)     Temp Source 08/25/23 1520 Oral     SpO2 08/25/23 1520 99 %     Weight 08/25/23 1525 161 lb (73 kg)     Height 08/25/23 1525 5' 6.5" (1.689 m)   Constitutional: Alert and oriented. Well appearing and in no acute distress. Eyes: Conjunctivae are normal.  Head: Atraumatic. Nose: No congestion/rhinnorhea. Mouth/Throat: Mucous membranes are moist. Neck: No stridor.   Cardiovascular: Normal rate, regular rhythm. Good peripheral circulation. Grossly normal heart sounds.   Respiratory: Normal respiratory effort.  No retractions. Lungs CTAB. Gastrointestinal: Soft and nontender. No distention.  Musculoskeletal: No lower extremity tenderness with trace edema in both ankles. No unilateral swelling appreciated. No gross deformities of extremities. Neurologic:  Normal speech and language. No gross focal neurologic deficits are appreciated.  Skin:  Skin is warm, dry and intact. No rash noted.  ____________________________________________   LABS (all labs ordered are listed, but only abnormal results are displayed)  Labs Reviewed  URINE CULTURE - Abnormal; Notable for the following components:      Result Value   Culture 80,000 COLONIES/mL ENTEROCOCCUS FAECALIS (*)    Organism ID, Bacteria ENTEROCOCCUS FAECALIS (*)    All other components within normal limits  COMPREHENSIVE METABOLIC PANEL WITH GFR - Abnormal; Notable for the following components:   Glucose, Bld 121 (*)    Creatinine, Ser 1.05 (*)    Total Protein 6.3 (*)  GFR, Estimated 53 (*)    All other components within normal limits  CBC WITH DIFFERENTIAL/PLATELET - Abnormal; Notable for the following components:   Hemoglobin 11.2 (*)    HCT 34.8 (*)    All other components within normal limits  URINALYSIS, ROUTINE W REFLEX MICROSCOPIC - Abnormal; Notable for the following components:   APPearance HAZY (*)    Leukocytes,Ua LARGE (*)    All other components within normal limits  URINALYSIS,  MICROSCOPIC (REFLEX) - Abnormal; Notable for the following components:   Bacteria, UA FEW (*)    All other components within normal limits  TROPONIN T, HIGH SENSITIVITY    ____________________________________________   PROCEDURES  Procedure(s) performed:   Procedures  None  ____________________________________________   INITIAL IMPRESSION / ASSESSMENT AND PLAN / ED COURSE  Pertinent labs & imaging results that were available during my care of the patient were reviewed by me and considered in my medical decision making (see chart for details).   This patient is Presenting for Evaluation of weakness, which does require a range of treatment options, and is a complaint that involves a high risk of morbidity and mortality.  The Differential Diagnoses include medication reaction, dehydration, medication intolerance, sepsis, ACS, etc.  Critical Interventions-    Medications  sodium chloride  0.9 % bolus 500 mL (0 mLs Intravenous Stopped 08/25/23 1713)    Reassessment after intervention:  BP improved.   Clinical Laboratory Tests Ordered, included UA is equivocal. No AKI. Normal lytes. Mild anemia.   Cardiac Monitor Tracing which shows no bradycardia.    Social Determinants of Health Risk patient is a non-smoker.   Medical Decision Making: Summary:  Patient presents emergency department valuation of weakness with low BP.  Overall looks well.  Nonfocal neuroexam.  Soft abdomen.  No chest pain or atypical ACS suspicion but given age plan for EKG, troponin, screening blood work and UA.  Reevaluation with update and discussion with patient. Will old amlodipine  and follow with PCP.   Patient's presentation is most consistent with acute presentation with potential threat to life or bodily function.   Disposition: discharge  ____________________________________________  FINAL CLINICAL IMPRESSION(S) / ED DIAGNOSES  Final diagnoses:  Weakness    Note:  This document was prepared  using Dragon voice recognition software and may include unintentional dictation errors.  Abby Hocking, MD, [facility] Emergency Medicine    Keziah Avis, Shereen Dike, MD 08/28/23 [phone]

## 2023-08-25 NOTE — Discharge Instructions (Signed)
 Please continue home medication.  I am starting you on an antibiotic in case you may be developing a urine infection.  Return with any new or suddenly worsening symptoms.

## 2023-08-27 LAB — URINE CULTURE: Culture: 80000 — AB

## 2023-08-28 ENCOUNTER — Telehealth (HOSPITAL_BASED_OUTPATIENT_CLINIC_OR_DEPARTMENT_OTHER): Payer: Self-pay | Admitting: *Deleted

## 2023-08-28 NOTE — Telephone Encounter (Signed)
 Post ED Visit - Positive Culture Follow-up  Culture report reviewed by antimicrobial stewardship pharmacist: [facility] []  [place], Pharm.D. []  Skeet Duke, Pharm.D., BCPS AQ-ID []  Leslee Rase, Pharm.D., BCPS []  Garland Junk, Pharm.D., BCPS []  [place], [place].D., BCPS, AAHIVP []  Alcide Aly, Pharm.D., BCPS, AAHIVP []  Jerri Morale, PharmD, BCPS []  Graham Laws, PharmD, BCPS []  Cleda Curly, PharmD, BCPS []  Tamar Fairly, PharmD []  Ballard Levels, PharmD, BCPS [x]  Sheryle Donning, PharmD  [facility] []  Arlyne Bering, PharmD []  Sherryle Don, PharmD []  Van Gelinas, PharmD []  Delila Felty, Rph []  [place]) [name], PharmD []  Augustina Block, PharmD []  Arie Kurtz, PharmD []  Sharlyn Deaner, PharmD []  Agnes Hose, PharmD []  Kendall Pauls, PharmD []  Gladstone Lamer, PharmD []  Armanda Bern, PharmD []  Tera Fellows, PharmD   Positive urine culture Treated with Cephalexin  and no further patient follow-up is required at this time.  Victoria Lyons 08/28/2023, 9:45 AM

## 2023-09-22 IMAGING — DX DG CHEST 1V PORT
1 series · 1 of 1 positions shown · non-contrast
Comparison: 08/10/2017

CLINICAL DATA: Cough and shortness of breath.  Flu like symptoms.

EXAM:
PORTABLE CHEST 1 VIEW

[chest ap]
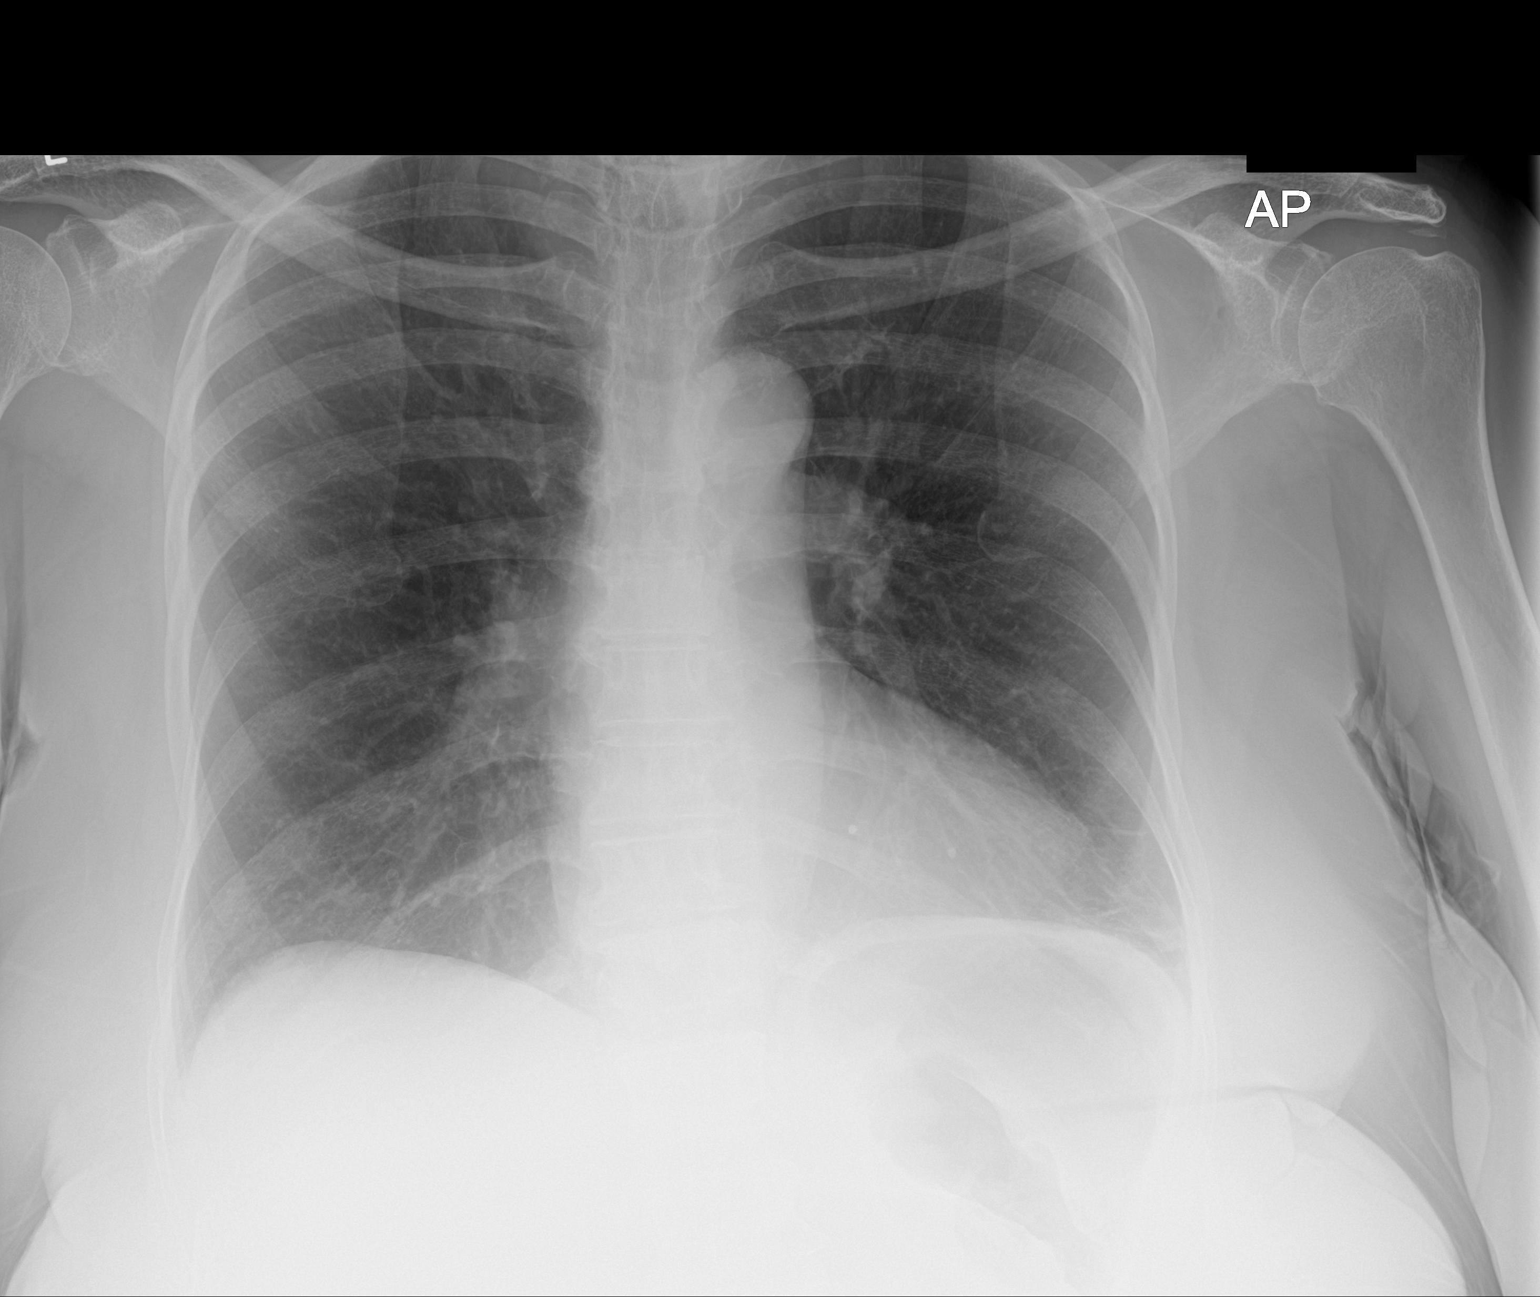

[1 of 1 positions shown; findings below may reference images not displayed]

FINDINGS: The cardiomediastinal contours are normal. Minor left lung base
scarring, stable in appearance. Pulmonary vasculature is normal. No
consolidation, pleural effusion, or pneumothorax. No acute osseous
abnormalities are seen.
IMPRESSION: No acute chest finding. Minor left lung base scarring.

## 2024-04-08 ENCOUNTER — Other Ambulatory Visit: Payer: Self-pay

## 2024-04-08 ENCOUNTER — Inpatient Hospital Stay (HOSPITAL_BASED_OUTPATIENT_CLINIC_OR_DEPARTMENT_OTHER)
Admission: EM | Admit: 2024-04-08 | Discharge: 2024-04-14 | DRG: 683 | Disposition: A | Discharge status: Home / Self Care | Attending: Internal Medicine | Admitting: Internal Medicine

## 2024-04-08 ENCOUNTER — Emergency Department (HOSPITAL_BASED_OUTPATIENT_CLINIC_OR_DEPARTMENT_OTHER)

## 2024-04-08 ENCOUNTER — Encounter (HOSPITAL_BASED_OUTPATIENT_CLINIC_OR_DEPARTMENT_OTHER): Payer: Self-pay | Admitting: Emergency Medicine

## 2024-04-08 DIAGNOSIS — R197 Diarrhea, unspecified: Secondary | ICD-10-CM

## 2024-04-08 DIAGNOSIS — I700 Atherosclerosis of aorta: Secondary | ICD-10-CM | POA: Diagnosis present

## 2024-04-08 DIAGNOSIS — N179 Acute kidney failure, unspecified: Principal | ICD-10-CM | POA: Diagnosis present

## 2024-04-08 DIAGNOSIS — N1831 Chronic kidney disease, stage 3a: Secondary | ICD-10-CM | POA: Diagnosis not present

## 2024-04-08 DIAGNOSIS — Z881 Allergy status to other antibiotic agents status: Secondary | ICD-10-CM

## 2024-04-08 DIAGNOSIS — E785 Hyperlipidemia, unspecified: Secondary | ICD-10-CM | POA: Diagnosis present

## 2024-04-08 DIAGNOSIS — R1110 Vomiting, unspecified: Secondary | ICD-10-CM | POA: Diagnosis not present

## 2024-04-08 DIAGNOSIS — R824 Acetonuria: Secondary | ICD-10-CM | POA: Diagnosis present

## 2024-04-08 DIAGNOSIS — E860 Dehydration: Secondary | ICD-10-CM | POA: Diagnosis present

## 2024-04-08 DIAGNOSIS — D631 Anemia in chronic kidney disease: Secondary | ICD-10-CM | POA: Diagnosis present

## 2024-04-08 DIAGNOSIS — Z79899 Other long term (current) drug therapy: Secondary | ICD-10-CM

## 2024-04-08 DIAGNOSIS — E872 Acidosis, unspecified: Secondary | ICD-10-CM | POA: Diagnosis present

## 2024-04-08 DIAGNOSIS — E8809 Other disorders of plasma-protein metabolism, not elsewhere classified: Secondary | ICD-10-CM | POA: Diagnosis present

## 2024-04-08 DIAGNOSIS — I10 Essential (primary) hypertension: Secondary | ICD-10-CM | POA: Diagnosis present

## 2024-04-08 DIAGNOSIS — Z888 Allergy status to other drugs, medicaments and biological substances status: Secondary | ICD-10-CM

## 2024-04-08 DIAGNOSIS — A084 Viral intestinal infection, unspecified: Secondary | ICD-10-CM | POA: Diagnosis present

## 2024-04-08 DIAGNOSIS — N1832 Chronic kidney disease, stage 3b: Secondary | ICD-10-CM | POA: Diagnosis present

## 2024-04-08 DIAGNOSIS — Z96641 Presence of right artificial hip joint: Secondary | ICD-10-CM | POA: Diagnosis present

## 2024-04-08 DIAGNOSIS — E663 Overweight: Secondary | ICD-10-CM | POA: Diagnosis present

## 2024-04-08 DIAGNOSIS — Z6824 Body mass index (BMI) 24.0-24.9, adult: Secondary | ICD-10-CM

## 2024-04-08 DIAGNOSIS — E876 Hypokalemia: Secondary | ICD-10-CM | POA: Diagnosis present

## 2024-04-08 DIAGNOSIS — R112 Nausea with vomiting, unspecified: Secondary | ICD-10-CM

## 2024-04-08 DIAGNOSIS — N1330 Unspecified hydronephrosis: Secondary | ICD-10-CM | POA: Diagnosis present

## 2024-04-08 DIAGNOSIS — Z90710 Acquired absence of both cervix and uterus: Secondary | ICD-10-CM

## 2024-04-08 DIAGNOSIS — E039 Hypothyroidism, unspecified: Secondary | ICD-10-CM | POA: Diagnosis present

## 2024-04-08 DIAGNOSIS — R000 Tachycardia, unspecified: Secondary | ICD-10-CM | POA: Diagnosis present

## 2024-04-08 DIAGNOSIS — H409 Unspecified glaucoma: Secondary | ICD-10-CM | POA: Diagnosis present

## 2024-04-08 DIAGNOSIS — I129 Hypertensive chronic kidney disease with stage 1 through stage 4 chronic kidney disease, or unspecified chronic kidney disease: Secondary | ICD-10-CM | POA: Diagnosis present

## 2024-04-08 DIAGNOSIS — N281 Cyst of kidney, acquired: Secondary | ICD-10-CM | POA: Diagnosis present

## 2024-04-08 DIAGNOSIS — Z91040 Latex allergy status: Secondary | ICD-10-CM

## 2024-04-08 LAB — CBC WITH DIFFERENTIAL/PLATELET
Abs Immature Granulocytes: 0.01 K/uL (ref 0.00–0.07)
Basophils Absolute: 0 K/uL (ref 0.0–0.1)
Basophils Relative: 0 %
Eosinophils Absolute: 0 K/uL (ref 0.0–0.5)
Eosinophils Relative: 0 %
HCT: 41.6 % (ref 36.0–46.0)
Hemoglobin: 13.4 g/dL (ref 12.0–15.0)
Immature Granulocytes: 0 %
Lymphocytes Relative: 17 %
Lymphs Abs: 1.3 K/uL (ref 0.7–4.0)
MCH: 27 pg (ref 26.0–34.0)
MCHC: 32.2 g/dL (ref 30.0–36.0)
MCV: 83.7 fL (ref 80.0–100.0)
Monocytes Absolute: 0.9 K/uL (ref 0.1–1.0)
Monocytes Relative: 11 %
Neutro Abs: 5.7 K/uL (ref 1.7–7.7)
Neutrophils Relative %: 72 %
Platelets: 281 K/uL (ref 150–400)
RBC: 4.97 MIL/uL (ref 3.87–5.11)
RDW: 14.8 % (ref 11.5–15.5)
Smear Review: NORMAL
WBC: 8 K/uL (ref 4.0–10.5)
nRBC: 0 % (ref 0.0–0.2)

## 2024-04-08 LAB — URINALYSIS, ROUTINE W REFLEX MICROSCOPIC
Glucose, UA: NEGATIVE mg/dL
Ketones, ur: 80 mg/dL — AB
Nitrite: NEGATIVE
Protein, ur: 100 mg/dL — AB
Specific Gravity, Urine: >=1.03 (ref 1.005–1.030)
pH: 5.5 (ref 5.0–8.0)

## 2024-04-08 LAB — COMPREHENSIVE METABOLIC PANEL WITH GFR
ALT: 8 U/L (ref 0–44)
AST: 18 U/L (ref 15–41)
Albumin: 4.7 g/dL (ref 3.5–5.0)
Alkaline Phosphatase: 95 U/L (ref 38–126)
Anion gap: 17 — ABNORMAL HIGH (ref 5–15)
BUN: 32 mg/dL — ABNORMAL HIGH (ref 8–23)
CO2: 21 mmol/L — ABNORMAL LOW (ref 22–32)
Calcium: 9.8 mg/dL (ref 8.9–10.3)
Chloride: 103 mmol/L (ref 98–111)
Creatinine, Ser: 2 mg/dL — ABNORMAL HIGH (ref 0.44–1.00)
GFR, Estimated: 25 mL/min — ABNORMAL LOW
Glucose, Bld: 107 mg/dL — ABNORMAL HIGH (ref 70–99)
Potassium: 3.7 mmol/L (ref 3.5–5.1)
Sodium: 141 mmol/L (ref 135–145)
Total Bilirubin: 0.6 mg/dL (ref 0.0–1.2)
Total Protein: 8 g/dL (ref 6.5–8.1)

## 2024-04-08 LAB — URINALYSIS, MICROSCOPIC (REFLEX)

## 2024-04-08 LAB — LIPASE, BLOOD: Lipase: 15 U/L (ref 11–51)

## 2024-04-08 MED ORDER — FAMOTIDINE IN NACL 20-0.9 MG/50ML-% IV SOLN
20.0000 mg | Freq: Once | INTRAVENOUS | Status: AC
  Administered 2024-04-08: 20 mg via INTRAVENOUS
  Filled 2024-04-08: qty 50

## 2024-04-08 MED ORDER — ONDANSETRON HCL 4 MG/2ML IJ SOLN
4.0000 mg | Freq: Once | INTRAMUSCULAR | Status: AC
  Administered 2024-04-08: 4 mg via INTRAVENOUS
  Filled 2024-04-08: qty 2

## 2024-04-08 MED ORDER — SODIUM CHLORIDE 0.9 % IV BOLUS
1000.0000 mL | Freq: Once | INTRAVENOUS | Status: AC
  Administered 2024-04-08: 1000 mL via INTRAVENOUS

## 2024-04-08 MED ORDER — LACTATED RINGERS IV SOLN: INTRAVENOUS | Status: DC

## 2024-04-08 NOTE — ED Provider Notes (Addendum)
 " [facility] Provider Note  CSN: [id number] Arrival date & time: 04/08/24 1622  Chief Complaint(s) Diarrhea  HPI Victoria Lyons is a 83 y.o. female with past medical history as below, significant for DDD, hyperlipidemia, hypertension, who presents to the ED with complaint of diarrhea  Symptoms ongoing for around a week, initially had improvement to her symptoms on Tuesday and Wednesday her diarrhea returned.  Reports her bowel movements are nearly entirely liquid, no formed stool.  She tried drinking water  this morning around 3 AM and started vomiting.  Has not anything to eat today secondary to nausea and vomiting.  Emesis nonbloody nonbilious.  Denies any blood in her stool or melanotic stool.  No fevers or recent travel.  No recent antibiotics.  No recent hospitalization, denies history of C. difficile.  She has generalized abdominal cramping at times.  Sometimes when she is having a bowel movement she feels like she is having palpitations, no chest pain or dyspnea.  Not currently having palpitations.  Reports she took her blood pressure medication this morning.  Reports reduced p.o. intake last few days  Past Medical History 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   Osteopenia    Seasonal allergies    Vertigo    Patient Active Problem List   Diagnosis Date Noted   Osteoarthritis of right hip 09/26/2019   S/P hip replacement, right 09/26/2019   Status post hip replacement, right 09/26/2019   Home Medication(s) Prior to Admission medications  Medication Sig Start Date End Date Taking? Authorizing Provider  amLODipine  (NORVASC ) 5 MG tablet Take 5 mg by mouth daily.    [provider]  amLODipine  (NORVASC ) 5 MG tablet Take 1  tablet by mouth daily. 07/31/20   [provider]  amoxicillin (AMOXIL) 500 MG capsule For Dental procedures only Patient not taking: Reported on 05/21/2021 04/07/21   [provider]  fluticasone  (FLONASE ) 50 MCG/ACT nasal spray Place 1 spray into both nostrils daily as needed for allergies. 07/14/19   [provider]  loratadine  (CLARITIN ) 10 MG tablet Take 10 mg by mouth daily as needed for allergies.    [provider]  olopatadine  (PATANOL) 0.1 % ophthalmic solution Place 1 drop into both eyes 2 (two) times daily as needed for allergies. Patient not taking: Reported on 05/21/2021 07/14/19   [provider]  sennosides-docusate sodium  (SENOKOT-S) 8.6-50 MG tablet Take 2 tablets by mouth daily. Patient not taking: Reported on 05/21/2021 09/26/19   Brown, Blaine K, PA-C  Past Surgical History Past Surgical History:  Procedure Laterality Date   ABDOMINAL HYSTERECTOMY     ANKLE SURGERY Left    BREAST BIOPSY Right    COLONOSCOPY     HAND SURGERY Right    SHOULDER SURGERY Bilateral    thyroid nodule removal     TOTAL HIP ARTHROPLASTY Right 09/26/2019   Procedure: TOTAL HIP ARTHROPLASTY;  Surgeon: Josefina Chew, MD;  Location: [facility];  Service: Orthopedics;  Laterality: Right;   Family History History reviewed. No pertinent family history.  Social History Social History[1] Allergies Augmentin [amoxicillin-pot clavulanate], Ciprofloxacin, Demeclocycline, Latex, Tetanus toxoid-containing vaccines, and Tetracyclines & related  Review of Systems A thorough review of systems was obtained and all systems are negative except as noted in the HPI and PMH.   Physical Exam Vital Signs  I have reviewed the triage vital signs BP 137/63   Pulse (!) 105   Temp 98.7 F (37.1 C) (Oral)   Resp 18   Ht 5' 6.5 (1.689 m)   Wt 69.4 kg    SpO2 99%   BMI 24.32 kg/m  Physical Exam Vitals and nursing note reviewed.  Constitutional:      General: She is not in acute distress.    Appearance: Normal appearance. She is well-developed. She is not ill-appearing.  HENT:     Head: Normocephalic and atraumatic.     Right Ear: External ear normal.     Left Ear: External ear normal.     Nose: Nose normal.     Mouth/Throat:     Mouth: Mucous membranes are dry.  Eyes:     General: No scleral icterus.       Right eye: No discharge.        Left eye: No discharge.  Cardiovascular:     Rate and Rhythm: Tachycardia present.  Pulmonary:     Effort: Pulmonary effort is normal. No respiratory distress.     Breath sounds: No stridor.  Abdominal:     General: Abdomen is flat. There is no distension.     Palpations: Abdomen is soft.     Tenderness: There is no guarding.  Musculoskeletal:        General: No deformity.     Cervical back: No rigidity.  Skin:    General: Skin is warm and dry.     Coloration: Skin is not cyanotic, jaundiced or pale.  Neurological:     Mental Status: She is alert.  Psychiatric:        Speech: Speech normal.        Behavior: Behavior normal. Behavior is cooperative.     ED Results and Treatments Labs (all labs ordered are listed, but only abnormal results are displayed) Labs Reviewed  COMPREHENSIVE METABOLIC PANEL WITH GFR - Abnormal; Notable for the following components:      Result Value   CO2 21 (*)    Glucose, Bld 107 (*)    BUN 32 (*)    Creatinine, Ser 2.00 (*)    GFR, Estimated 25 (*)    Anion gap 17 (*)    All other components within normal limits  URINALYSIS, ROUTINE W REFLEX MICROSCOPIC - Abnormal; Notable for the following components:   APPearance CLOUDY (*)    Hgb urine dipstick TRACE (*)    Bilirubin Urine LARGE (*)    Ketones, ur 80 (*)    Protein, ur 100 (*)    Leukocytes,Ua TRACE (*)    All other components within normal limits  URINALYSIS,  MICROSCOPIC (REFLEX) - Abnormal;  Notable for the following components:   Bacteria, UA FEW (*)    All other components within normal limits  GASTROINTESTINAL PANEL BY PCR, STOOL (REPLACES STOOL CULTURE)  C DIFFICILE QUICK SCREEN W PCR REFLEX    CBC WITH DIFFERENTIAL/PLATELET  LIPASE, BLOOD  BASIC METABOLIC PANEL WITH GFR  CBC                                                                                                                          Radiology CT ABDOMEN PELVIS WO CONTRAST Result Date: 04/08/2024 CLINICAL DATA:  Diarrhea EXAM: CT ABDOMEN AND PELVIS WITHOUT CONTRAST TECHNIQUE: Multidetector CT imaging of the abdomen and pelvis was performed following the standard protocol without IV contrast. RADIATION DOSE REDUCTION: This exam was performed according to the departmental dose-optimization program which includes automated exposure control, adjustment of the mA and/or kV according to patient size and/or use of iterative reconstruction technique. COMPARISON:  CT 07/23/2020 FINDINGS: Lower chest: Lung bases demonstrate no acute airspace disease. Bandlike atelectasis or scarring at the lingula, right middle lobe and lung bases. Hepatobiliary: No focal liver abnormality is seen. No gallstones, gallbladder wall thickening, or biliary dilatation. Pancreas: Unremarkable. No pancreatic ductal dilatation or surrounding inflammatory changes. Spleen: Normal in size without focal abnormality. Adrenals/Urinary Tract: Adrenal glands are unremarkable. Kidneys are normal, without renal calculi, focal lesion, or hydronephrosis. Bladder is unremarkable. Stomach/Bowel: Stomach within normal limits. Diffuse fluid-filled nondilated small bowel. Diffuse fluid within the colon. No acute bowel wall thickening. Upper normal size of appendix measuring up to 7 mm which is also fluid-filled but no convincing Peri appendiceal stranding. Vascular/Lymphatic: Aortic atherosclerosis. No enlarged abdominal or pelvic lymph nodes. Reproductive: Status post  hysterectomy. No adnexal masses. Other: No ascites or free air Musculoskeletal: Artifact from right hip arthroplasty. No acute or suspicious osseous abnormality IMPRESSION: 1. Diffuse fluid-filled nondilated small bowel and colon, findings are suggestive of enteritis or ileus. No evidence for bowel obstruction or bowel wall thickening. 2. Upper normal size of appendix that is diffusely fluid-filled, but no definitive periappendiceal stranding to suggest active inflammation 3. Aortic atherosclerosis. Aortic Atherosclerosis (ICD10-I70.0). Electronically Signed   By: Luke Bun M.D.   On: 04/08/2024 19:10    Pertinent labs & imaging results that were available during my care of the patient were reviewed by me and considered in my medical decision making (see MDM for details).  Medications Ordered in ED Medications  lactated ringers  infusion (has no administration in time range)  famotidine  (PEPCID ) IVPB 20 mg premix (has no administration in time range)  ondansetron  (ZOFRAN ) injection 4 mg (4 mg Intravenous Given 04/08/24 1719)  sodium chloride  0.9 % bolus 1,000 mL (0 mLs Intravenous Stopped 04/08/24 1822)  Procedures .Critical Care  Performed by: Elnor Jayson LABOR, DO Authorized by: Elnor Jayson LABOR, DO   Critical care provider statement:    Critical care time (minutes):  30   Critical care time was exclusive of:  Separately billable procedures and treating other patients   Critical care was necessary to treat or prevent imminent or life-threatening deterioration of the following conditions:  Dehydration   Critical care was time spent personally by me on the following activities:  Development of treatment plan with patient or surrogate, discussions with consultants, evaluation of patient's response to treatment, examination of patient, ordering and review of laboratory  studies, ordering and review of radiographic studies, ordering and performing treatments and interventions, pulse oximetry, re-evaluation of patient's condition, review of old charts and obtaining history from patient or surrogate   Care discussed with: admitting provider     (including critical care time)  Medical Decision Making / ED Course    Medical Decision Making:    Victoria Lyons is a 83 y.o. female with past medical history as below, significant for DDD, hyperlipidemia, hypertension, who presents to the ED with complaint of diarrhea. The complaint involves an extensive differential diagnosis and also carries with it a high risk of complications and morbidity.  Serious etiology was considered. Ddx includes but is not limited to: Colitis, gastroenteritis, foodborne illness, C. difficile, infectious diarrhea, bowel obstruction, volvulus, cystitis etc.  Complete initial physical exam performed, notably the patient was in no acute distress, heart rate is elevated.    Reviewed and confirmed nursing documentation for past medical history, family history, social history.  Vital signs reviewed.    N/V/D Abd cramping > - tachycardia noted, appears dry, give fluids - labs - as below, aki - CTAP as below - stool studies pending - pt reports symptoms improving   Clinical Course as of 04/08/24 2114  Sat Apr 08, 2024  1916 CT concerning for enteritis or ileus, appendix is mildly enlarged but wnl per radiology   Cr doubled from baseline AG noted Bicarb reduced Ketonuria noted  [SG]  2030 Pt reports mild improvement in nausea w/ zofran , able to drink a few sips of liquid   Symptoms improving  [SG]  2030 Recommend admission for AKI likely due to volume loss. Pt is 83, lives alone.  [SG]    Clinical Course User Index [SG] Elnor Jayson LABOR, DO     Recommend admission Pt/family agree DR howerter accepts pt for admission               Additional history  obtained: -Additional history obtained from family -External records from outside source obtained and reviewed including: Chart review including previous notes, labs, imaging, consultation notes including  Prior labs   Lab Tests: -I ordered, reviewed, and interpreted labs.   The pertinent results include:   Labs Reviewed  COMPREHENSIVE METABOLIC PANEL WITH GFR - Abnormal; Notable for the following components:      Result Value   CO2 21 (*)    Glucose, Bld 107 (*)    BUN 32 (*)    Creatinine, Ser 2.00 (*)    GFR, Estimated 25 (*)    Anion gap 17 (*)    All other components within normal limits  URINALYSIS, ROUTINE W REFLEX MICROSCOPIC - Abnormal; Notable for the following components:   APPearance CLOUDY (*)    Hgb urine dipstick TRACE (*)    Bilirubin Urine LARGE (*)    Ketones, ur 80 (*)    Protein,  ur 100 (*)    Leukocytes,Ua TRACE (*)    All other components within normal limits  URINALYSIS, MICROSCOPIC (REFLEX) - Abnormal; Notable for the following components:   Bacteria, UA FEW (*)    All other components within normal limits  GASTROINTESTINAL PANEL BY PCR, STOOL (REPLACES STOOL CULTURE)  C DIFFICILE QUICK SCREEN W PCR REFLEX    CBC WITH DIFFERENTIAL/PLATELET  LIPASE, BLOOD  BASIC METABOLIC PANEL WITH GFR  CBC    Notable for as above, aki  EKG   EKG Interpretation Date/Time:  Saturday April 08 2024 17:05:38 EST Ventricular Rate:  120 PR Interval:  141 QRS Duration:  80 QT Interval:  312 QTC Calculation: 441 R Axis:   -68  Text Interpretation: Sinus tachycardia Consider right atrial enlargement Left anterior fascicular block Consider left ventricular hypertrophy Anterior Q waves, possibly due to LVH Interpretation limited secondary to artifact Confirmed by Elnor Savant ([id number]) on 04/08/2024 5:12:38 PM         Imaging Studies ordered: I ordered imaging studies including CTAP I independently visualized the following imaging with scope of interpretation  limited to determining acute life threatening conditions related to emergency care; findings noted above I agree with the radiologist interpretation If any imaging was obtained with contrast I closely monitored patient for any possible adverse reaction a/w contrast administration in the emergency department   Medicines ordered and prescription drug management: Meds ordered this encounter  Medications   ondansetron  (ZOFRAN ) injection 4 mg   sodium chloride  0.9 % bolus 1,000 mL   lactated ringers  infusion   famotidine  (PEPCID ) IVPB 20 mg premix    -I have reviewed the patients home medicines and have made adjustments as needed   Consultations Obtained: I requested consultation with the [facility],  and discussed lab and imaging findings as well as pertinent plan    Cardiac Monitoring: The patient was maintained on a cardiac monitor.  I personally viewed and interpreted the cardiac monitored which showed an underlying rhythm of: sinus tachy > improving Continuous pulse oximetry interpreted by myself, 99% on RA.    Social Determinants of Health:  Diagnosis or treatment significantly limited by social determinants of health: lives alone   Reevaluation: After the interventions noted above, I reevaluated the patient and found that they have improved  Co morbidities that complicate the patient evaluation  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   Osteopenia    Seasonal allergies    Vertigo       Dispostion: Disposition decision including need for hospitalization was considered, and patient was admitted to hospital.     Final Clinical Impression(s) / ED Diagnoses Final diagnoses:  AKI (acute kidney injury)  Nausea vomiting and diarrhea        Elnor Savant LABOR, DO 04/08/24  2114     [1]  Social History Tobacco Use   Smoking status: Never   Smokeless tobacco: Never  Substance Use Topics   Alcohol use: Never   Drug use: Never     Elnor Savant LABOR, DO 04/08/24 2114  "

## 2024-04-08 NOTE — ED Notes (Signed)
 [facility] called for transport.

## 2024-04-08 NOTE — ED Triage Notes (Signed)
 Pt c/o diarrhea since Thurs; denies pain; thinks it's d/t something she ate

## 2024-04-08 NOTE — Progress Notes (Signed)
 Hospitalist Transfer Note:    Nursing staff, Please call [facility] Admits & Consults System-Wide number on Amion [phone]) as soon as patient's arrival, so appropriate admitting provider can evaluate the pt.  Transferring facility: [facility] Requesting provider: Dr. Jayson Pereyra (EDP at [facility]) Reason for transfer: admission for further evaluation and management of AKI, dehydration, N/V/D, with CT scan suggestive of enteritis.     27 F w/ history of HTN, HLD, who presented to [facility] ED complaining of nausea, vomiting, diarrhea.   The patient reports that she has been experiencing persistent watery diarrhea for the last 1 week.  After the first few days of loose stool, she felt that the frequency of episodes of loose stool started to decrease, before escalating again over the last 2 to 3 days.  Subsequently, over the last 1 to 2 days, she has developed recurrent nausea/vomiting in the absence of any associated hematemesis.  She notes associated significant decline in oral intake over the last few days.  Per my discussion with the EDP, patient is not recently been on any antibiotics.  She lives at home by herself.  Vital signs in the ED were notable for the following: Afebrile; heart rates initially in the 120s to 130s, septally decreasing into the low 100s with IV fluids; systolic blood pressures in the 120s to 130s.  Labs were notable for CMP which showed creatinine of 2.0 relative to the patient's reported baseline creatinine of closer to 1.0.  Liver enzymes are within normal limits.  Lipase was 15.  CBC notable for white cell count of 8000.  GI panel by PCR has been ordered, with result currently pending.  Imaging notable for CT abdomen/pelvis without contrast showed diffuse fluid-filled nondilated small bowel and colon, which, per radiology, was suggestive of enteritis versus ileus, without any evidence of bowel obstruction, abscess, perforation, or bowel wall thickening.  Medications administered prior  to transfer included the following: Zofran  4 mg IV x 1 dose, normal saline x 1 L bolus followed by initiation of continuous LR running at 100 cc/h.  Pepcid  20 mg IV x 1 dose.  Subsequently, I accepted this patient for transfer for observation to a med-tele bed at [facility] or [facility]  (first available) for further work-up and management of the above.       Eva Pore, DO Hospitalist

## 2024-04-08 NOTE — ED Notes (Signed)
 Patient transported to CT

## 2024-04-09 ENCOUNTER — Encounter (HOSPITAL_COMMUNITY): Payer: Self-pay | Admitting: Family Medicine

## 2024-04-09 DIAGNOSIS — N1831 Chronic kidney disease, stage 3a: Secondary | ICD-10-CM | POA: Diagnosis not present

## 2024-04-09 DIAGNOSIS — N170 Acute kidney failure with tubular necrosis: Secondary | ICD-10-CM

## 2024-04-09 DIAGNOSIS — R1110 Vomiting, unspecified: Secondary | ICD-10-CM | POA: Diagnosis present

## 2024-04-09 DIAGNOSIS — R197 Diarrhea, unspecified: Secondary | ICD-10-CM | POA: Diagnosis not present

## 2024-04-09 DIAGNOSIS — I10 Essential (primary) hypertension: Secondary | ICD-10-CM | POA: Diagnosis not present

## 2024-04-09 LAB — GASTROINTESTINAL PANEL BY PCR, STOOL (REPLACES STOOL CULTURE)

## 2024-04-09 LAB — BASIC METABOLIC PANEL WITH GFR
Anion gap: 12 (ref 5–15)
BUN: 30 mg/dL — ABNORMAL HIGH (ref 8–23)
CO2: 19 mmol/L — ABNORMAL LOW (ref 22–32)
Calcium: 8.7 mg/dL — ABNORMAL LOW (ref 8.9–10.3)
Chloride: 109 mmol/L (ref 98–111)
Creatinine, Ser: 1.47 mg/dL — ABNORMAL HIGH (ref 0.44–1.00)
GFR, Estimated: 35 mL/min — ABNORMAL LOW
Glucose, Bld: 112 mg/dL — ABNORMAL HIGH (ref 70–99)
Potassium: 3.3 mmol/L — ABNORMAL LOW (ref 3.5–5.1)
Sodium: 141 mmol/L (ref 135–145)

## 2024-04-09 LAB — GLUCOSE, CAPILLARY: Glucose-Capillary: 97 mg/dL (ref 70–99)

## 2024-04-09 LAB — CBC
HCT: 37.1 % (ref 36.0–46.0)
Hemoglobin: 12 g/dL (ref 12.0–15.0)
MCH: 27.3 pg (ref 26.0–34.0)
MCHC: 32.3 g/dL (ref 30.0–36.0)
MCV: 84.3 fL (ref 80.0–100.0)
Platelets: 246 K/uL (ref 150–400)
RBC: 4.4 MIL/uL (ref 3.87–5.11)
RDW: 14.8 % (ref 11.5–15.5)
WBC: 7.2 K/uL (ref 4.0–10.5)
nRBC: 0 % (ref 0.0–0.2)

## 2024-04-09 LAB — C DIFFICILE QUICK SCREEN W PCR REFLEX
C Diff antigen: NEGATIVE
C Diff interpretation: NOT DETECTED
C Diff toxin: NEGATIVE

## 2024-04-09 MED ORDER — ACETAMINOPHEN 650 MG RE SUPP: 650.0000 mg | Freq: Four times a day (QID) | RECTAL | Status: DC | PRN

## 2024-04-09 MED ORDER — ONDANSETRON HCL 4 MG/2ML IJ SOLN
4.0000 mg | Freq: Four times a day (QID) | INTRAMUSCULAR | Status: DC | PRN
  Administered 2024-04-09 – 2024-04-13 (×6): 4 mg via INTRAVENOUS
  Filled 2024-04-09 (×6): qty 2

## 2024-04-09 MED ORDER — POTASSIUM CHLORIDE CRYS ER 20 MEQ PO TBCR
20.0000 meq | EXTENDED_RELEASE_TABLET | Freq: Two times a day (BID) | ORAL | Status: DC
  Administered 2024-04-09 (×2): 20 meq via ORAL
  Filled 2024-04-09 (×2): qty 1

## 2024-04-09 MED ORDER — PROCHLORPERAZINE EDISYLATE 10 MG/2ML IJ SOLN
5.0000 mg | Freq: Four times a day (QID) | INTRAMUSCULAR | Status: DC | PRN
  Filled 2024-04-09: qty 2

## 2024-04-09 MED ORDER — HEPARIN SODIUM (PORCINE) 5000 UNIT/ML IJ SOLN
5000.0000 [IU] | Freq: Three times a day (TID) | INTRAMUSCULAR | Status: DC
  Administered 2024-04-09 – 2024-04-14 (×17): 5000 [IU] via SUBCUTANEOUS
  Filled 2024-04-09 (×17): qty 1

## 2024-04-09 MED ORDER — AMLODIPINE BESYLATE 5 MG PO TABS
5.0000 mg | ORAL_TABLET | Freq: Every day | ORAL | Status: DC
  Administered 2024-04-09: 5 mg via ORAL
  Filled 2024-04-09 (×2): qty 1

## 2024-04-09 MED ORDER — LACTATED RINGERS IV SOLN: INTRAVENOUS | Status: DC

## 2024-04-09 MED ORDER — LACTATED RINGERS IV SOLN: INTRAVENOUS | Status: AC

## 2024-04-09 MED ORDER — OXYCODONE HCL 5 MG PO TABS
2.5000 mg | ORAL_TABLET | ORAL | Status: DC | PRN
  Administered 2024-04-10 – 2024-04-14 (×7): 5 mg via ORAL
  Filled 2024-04-09 (×8): qty 1

## 2024-04-09 MED ORDER — ACETAMINOPHEN 325 MG PO TABS
650.0000 mg | ORAL_TABLET | Freq: Four times a day (QID) | ORAL | Status: DC | PRN
  Administered 2024-04-14: 650 mg via ORAL
  Filled 2024-04-09: qty 2

## 2024-04-09 MED ORDER — LOPERAMIDE HCL 2 MG PO CAPS
2.0000 mg | ORAL_CAPSULE | ORAL | Status: DC | PRN
  Administered 2024-04-09 – 2024-04-13 (×12): 2 mg via ORAL
  Filled 2024-04-09 (×12): qty 1

## 2024-04-09 MED ORDER — ONDANSETRON HCL 4 MG PO TABS: 4.0000 mg | ORAL_TABLET | Freq: Four times a day (QID) | ORAL | Status: DC | PRN

## 2024-04-09 NOTE — Plan of Care (Signed)

## 2024-04-09 NOTE — H&P (Signed)
 " History and Physical    Victoria Lyons FMW:[id number] DOB: Apr 30, 1940 DOA: 04/08/2024  PCP: Douglass Gerard DEL, PA-C   Patient coming from: Home   Chief Complaint: N/V/D   HPI: Victoria Lyons is a 83 y.o. female with medical history significant for hypertension, degenerative disc disease, glaucoma, and vertigo who presents with nausea, vomiting, and diarrhea.  Patient has had watery stools for several days and then developed nausea with nonbloody vomiting early this morning.  She denies any significant abdominal pain, fevers, chills, recent travel, recent antibiotics, or known sick contacts.  She also denies chest pain, cough, or shortness of breath.  [facility] ED Course: Upon arrival to the ED, patient is found to be afebrile and saturating well on room air with elevated heart rate and stable blood pressure.  Labs are most notable for BUN 32, creatinine 2.00, normal CBC, normal LFTs, and normal lipase.  Stool was sent for GI pathogen panel and C. difficile testing, she was treated with 1 L NS, Zofran , and Pepcid , and was transferred to [facility] for admission.  Review of Systems:  All other systems reviewed and apart from HPI, are negative.  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   Osteopenia    Seasonal allergies    Vertigo     Past Surgical History:  Procedure Laterality Date   ABDOMINAL HYSTERECTOMY     ANKLE SURGERY Left    BREAST BIOPSY Right    COLONOSCOPY     HAND SURGERY Right    SHOULDER SURGERY Bilateral    thyroid nodule removal     TOTAL HIP ARTHROPLASTY Right 09/26/2019   Procedure: TOTAL HIP ARTHROPLASTY;  Surgeon: Josefina Chew, MD;  Location: [facility] ORS;  Service: Orthopedics;  Laterality: Right;    Social History:   reports that she has never smoked.  She has never used smokeless tobacco. She reports that she does not drink alcohol and does not use drugs.  Allergies[1]  History reviewed. No pertinent family history.   Prior to Admission medications  Medication Sig Start Date End Date Taking? Authorizing Provider  amLODipine  (NORVASC ) 5 MG tablet Take 5 mg by mouth daily.    [provider]  amLODipine  (NORVASC ) 5 MG tablet Take 1 tablet by mouth daily. 07/31/20   [provider]  amoxicillin (AMOXIL) 500 MG capsule For Dental procedures only Patient not taking: Reported on 05/21/2021 04/07/21   [provider]  fluticasone  (FLONASE ) 50 MCG/ACT nasal spray Place 1 spray into both nostrils daily as needed for allergies. 07/14/19   [provider]  loratadine  (CLARITIN ) 10 MG tablet Take 10 mg by mouth daily as needed for allergies.    [provider]  olopatadine  (PATANOL) 0.1 % ophthalmic solution Place 1 drop into both eyes 2 (two) times daily as needed for allergies. Patient not taking: Reported on 05/21/2021 07/14/19   [provider]  sennosides-docusate sodium  (SENOKOT-S) 8.6-50 MG tablet Take 2 tablets by mouth daily. Patient not taking: Reported on 05/21/2021 09/26/19   Delores Army POUR, PA-C    Physical Exam: Vitals:   04/08/24 1637 04/08/24 1800 04/08/24 1815 04/08/24 2312  BP: 126/78 137/63 137/63 121/61  Pulse: (!) 134 (!) 109 ROLLEN)  105 (!) 102  Resp: 17 18  18   Temp: 98.2 F (36.8 C) 98.7 F (37.1 C)  98.9 F (37.2 C)  TempSrc: Oral Oral  Oral  SpO2: 97% 96% 99% 97%  Weight:    73.3 kg  Height:    5' 6 (1.676 m)    Constitutional: NAD, no pallor or diaphoresis  Eyes: PERTLA, lids and conjunctivae normal ENMT: Mucous membranes are moist. Posterior pharynx clear of any exudate or lesions.   Neck: supple, no masses  Respiratory: no wheezing, no crackles. No accessory muscle use.  Cardiovascular: S1 & S2 heard, regular rate and rhythm. No extremity edema.   Abdomen: No  tenderness, soft. Bowel sounds active.  Musculoskeletal: no clubbing / cyanosis. No joint deformity upper and lower extremities.   Skin: no significant rashes, lesions, ulcers. Warm, dry, well-perfused. Neurologic: CN 2-12 grossly intact. Moving all extremities. Alert and oriented to person, place, and situation.  Psychiatric: Calm. Cooperative.    Labs and Imaging on Admission: I have personally reviewed following labs and imaging studies  CBC: Recent Labs  Lab 04/08/24 1716  WBC 8.0  NEUTROABS 5.7  HGB 13.4  HCT 41.6  MCV 83.7  PLT 281   Basic Metabolic Panel: Recent Labs  Lab 04/08/24 1716  NA 141  K 3.7  CL 103  CO2 21*  GLUCOSE 107*  BUN 32*  CREATININE 2.00*  CALCIUM 9.8   GFR: Estimated Creatinine Clearance: 22.6 mL/min (A) (by C-G formula based on SCr of 2 mg/dL (H)). Liver Function Tests: Recent Labs  Lab 04/08/24 1716  AST 18  ALT 8  ALKPHOS 95  BILITOT 0.6  PROT 8.0  ALBUMIN 4.7   Recent Labs  Lab 04/08/24 1716  LIPASE 15   No results for input(s): AMMONIA in the last 168 hours. Coagulation Profile: No results for input(s): INR, PROTIME in the last 168 hours. Cardiac Enzymes: No results for input(s): CKTOTAL, CKMB, CKMBINDEX, TROPONINI in the last 168 hours. BNP (last 3 results) No results for input(s): PROBNP in the last 8760 hours. HbA1C: No results for input(s): HGBA1C in the last 72 hours. CBG: No results for input(s): GLUCAP in the last 168 hours. Lipid Profile: No results for input(s): CHOL, HDL, LDLCALC, TRIG, CHOLHDL, LDLDIRECT in the last 72 hours. Thyroid Function Tests: No results for input(s): TSH, T4TOTAL, FREET4, T3FREE, THYROIDAB in the last 72 hours. Anemia Panel: No results for input(s): VITAMINB12, FOLATE, FERRITIN, TIBC, IRON, RETICCTPCT in the last 72 hours. Urine analysis:    Component Value Date/Time   COLORURINE YELLOW 04/08/2024 1640   APPEARANCEUR CLOUDY  (A) 04/08/2024 1640   LABSPEC >=1.030 04/08/2024 1640   PHURINE 5.5 04/08/2024 1640   GLUCOSEU NEGATIVE 04/08/2024 1640   HGBUR TRACE (A) 04/08/2024 1640   BILIRUBINUR LARGE (A) 04/08/2024 1640   KETONESUR 80 (A) 04/08/2024 1640   PROTEINUR 100 (A) 04/08/2024 1640   NITRITE NEGATIVE 04/08/2024 1640   LEUKOCYTESUR TRACE (A) 04/08/2024 1640   Sepsis Labs: @LABRCNTIP (procalcitonin:4,lacticidven:4) )No results found for this or any previous visit (from the past 240 hours).   Radiological Exams on Admission: CT ABDOMEN PELVIS WO CONTRAST Result Date: 04/08/2024 CLINICAL DATA:  Diarrhea EXAM: CT ABDOMEN AND PELVIS WITHOUT CONTRAST TECHNIQUE: Multidetector CT imaging of the abdomen and pelvis was performed following the standard protocol without IV contrast. RADIATION DOSE REDUCTION: This exam was performed according to the departmental dose-optimization program which includes automated exposure control, adjustment of the mA and/or kV according to patient size and/or use of  iterative reconstruction technique. COMPARISON:  CT 07/23/2020 FINDINGS: Lower chest: Lung bases demonstrate no acute airspace disease. Bandlike atelectasis or scarring at the lingula, right middle lobe and lung bases. Hepatobiliary: No focal liver abnormality is seen. No gallstones, gallbladder wall thickening, or biliary dilatation. Pancreas: Unremarkable. No pancreatic ductal dilatation or surrounding inflammatory changes. Spleen: Normal in size without focal abnormality. Adrenals/Urinary Tract: Adrenal glands are unremarkable. Kidneys are normal, without renal calculi, focal lesion, or hydronephrosis. Bladder is unremarkable. Stomach/Bowel: Stomach within normal limits. Diffuse fluid-filled nondilated small bowel. Diffuse fluid within the colon. No acute bowel wall thickening. Upper normal size of appendix measuring up to 7 mm which is also fluid-filled but no convincing Peri appendiceal stranding. Vascular/Lymphatic: Aortic  atherosclerosis. No enlarged abdominal or pelvic lymph nodes. Reproductive: Status post hysterectomy. No adnexal masses. Other: No ascites or free air Musculoskeletal: Artifact from right hip arthroplasty. No acute or suspicious osseous abnormality IMPRESSION: 1. Diffuse fluid-filled nondilated small bowel and colon, findings are suggestive of enteritis or ileus. No evidence for bowel obstruction or bowel wall thickening. 2. Upper normal size of appendix that is diffusely fluid-filled, but no definitive periappendiceal stranding to suggest active inflammation 3. Aortic atherosclerosis. Aortic Atherosclerosis (ICD10-I70.0). Electronically Signed   By: Luke Bun M.D.   On: 04/08/2024 19:10    EKG: Independently reviewed. Sinus tachycardia, rate 120, LAFB.   Assessment/Plan   1. 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 N/V/D  - Presents with several days of diarrhea and one day of N/V - CT demonstrates fluid-filled non-dilated small bowel and colon - GI pathogen panel and C diff testing is in process  - She is tolerating clears, will continue to advance as tolerated, monitor/correct electrolytes   3. Hypertension  - Continue amlodipine      DVT prophylaxis: sq heparin   Code Status: Full  Level of Care: Level of care: Med-Surg Family Communication: None present  Disposition Plan:  Patient is from: home  Anticipated d/c is to: TBD Anticipated d/c date is: 04/11/24  Patient currently: Pending improved renal function, tolerance of adequate oral intake  Consults called: None  Admission status: Observation     Evalene GORMAN Sprinkles, MD [facility]  04/09/2024, 1:27 AM       [1]  Allergies Allergen Reactions   Augmentin  [Amoxicillin-Pot Clavulanate] Palpitations    B/p increase    Ciprofloxacin Rash    Joint pain also   Demeclocycline Other (See Comments)    Unsure of exact reaction type   Latex Rash   Tetanus Toxoid-Containing Vaccines Rash   Tetracyclines & Related Rash   "

## 2024-04-09 NOTE — Progress Notes (Signed)
 " PROGRESS NOTE    Victoria Lyons  FMW:[id number] DOB: Jun 13, 1940 DOA: 04/08/2024 PCP: Douglass Gerard DEL, PA-C    Brief Narrative:  83 year old with history of hypertension, glaucoma and vertigo presented with about 6 days of nausea, poor appetite, ongoing watery diarrhea.  Felt dehydrated.  In the emergency room blood pressure stable.  Afebrile.  On room air.  BUN 32, creatinine 2 with recently normal creatinine.  Admitted due to AKI, significant dehydration.  Subjective: Patient seen and examined.  She was able to drink some water .  He still feels thirsty.  She had 4 loose watery stool since early morning today.  Denies any abdominal pain or cramping.  She thinks her symptoms started after eating something.  No sick contacts.  Assessment & Plan:   Persistent diarrhea and nausea: Likely infective gastroenteritis.  Significantly dehydrated. Encourage oral intake and liquid intake Continue maintenance IV fluids today Intake output monitoring GI pathogen panel pending, will avoid use of Imodium  today.  Hypokalemia: Replace and monitor.  AKI: Due to above.  Already improving.  Encourage oral intake.  Continue maintenance fluids.  Recheck levels tomorrow morning.  Hypertension: Blood pressure stable.  Continue amlodipine .    DVT prophylaxis: heparin  injection 5,000 Units Start: 04/09/24 0600   Code Status: Full code Family Communication: None at the bedside Disposition Plan: Status is: Observation The patient will require care spanning > 2 midnights and should be moved to inpatient because: Significantly dehydrated, needs monitoring, IV fluids     Consultants:  None  Procedures:  None  Antimicrobials:  None     Objective: Vitals:   04/08/24 1815 04/08/24 2312 04/09/24 0327 04/09/24 0749  BP: 137/63 121/61 113/60 110/60  Pulse: (!) 105 (!) 102 (!) 108 (!) 103  Resp:  18 18 18   Temp:  98.9 F (37.2 C) 98.2 F (36.8 C) 98.9 F (37.2 C)  TempSrc:  Oral Oral   SpO2:  99% 97% 99% 99%  Weight:  73.3 kg    Height:  5' 6 (1.676 m)      Intake/Output Summary (Last 24 hours) at 04/09/2024 1156 Last data filed at 04/09/2024 1000 Gross per 24 hour  Intake 804.53 ml  Output --  Net 804.53 ml   Filed Weights   04/08/24 1636 04/08/24 2312  Weight: 69.4 kg 73.3 kg    Examination:  General exam: Appears calm and comfortable.  Slightly anxious.  Pleasant and interactive. Respiratory system: Clear to auscultation. Respiratory effort normal. Cardiovascular system: S1 & S2 heard, RRR.  Gastrointestinal system: Soft.  Nontender.  Bowel sound present. Central nervous system: Alert and oriented. No focal neurological deficits. Extremities: Symmetric 5 x 5 power.    Data Reviewed: I have personally reviewed following labs and imaging studies  CBC: Recent Labs  Lab 04/08/24 1716 04/09/24 0622  WBC 8.0 7.2  NEUTROABS 5.7  --   HGB 13.4 12.0  HCT 41.6 37.1  MCV 83.7 84.3  PLT 281 246   Basic Metabolic Panel: Recent Labs  Lab 04/08/24 1716 04/09/24 0622  NA 141 141  K 3.7 3.3*  CL 103 109  CO2 21* 19*  GLUCOSE 107* 112*  BUN 32* 30*  CREATININE 2.00* 1.47*  CALCIUM 9.8 8.7*   GFR: Estimated Creatinine Clearance: 30.8 mL/min (A) (by C-G formula based on SCr of 1.47 mg/dL (H)). Liver Function Tests: Recent Labs  Lab 04/08/24 1716  AST 18  ALT 8  ALKPHOS 95  BILITOT 0.6  PROT 8.0  ALBUMIN 4.7  Recent Labs  Lab 04/08/24 1716  LIPASE 15   No results for input(s): AMMONIA in the last 168 hours. Coagulation Profile: No results for input(s): INR, PROTIME in the last 168 hours. Cardiac Enzymes: No results for input(s): CKTOTAL, CKMB, CKMBINDEX, TROPONINI in the last 168 hours. BNP (last 3 results) No results for input(s): PROBNP in the last 8760 hours. HbA1C: No results for input(s): HGBA1C in the last 72 hours. CBG: Recent Labs  Lab 04/09/24 0753  GLUCAP 97   Lipid Profile: No results for input(s):  CHOL, HDL, LDLCALC, TRIG, CHOLHDL, LDLDIRECT in the last 72 hours. Thyroid Function Tests: No results for input(s): TSH, T4TOTAL, FREET4, T3FREE, THYROIDAB in the last 72 hours. Anemia Panel: No results for input(s): VITAMINB12, FOLATE, FERRITIN, TIBC, IRON, RETICCTPCT in the last 72 hours. Sepsis Labs: No results for input(s): PROCALCITON, LATICACIDVEN in the last 168 hours.  Recent Results (from the past 240 hours)  C Difficile Quick Screen w PCR reflex     Status: None   Collection Time: 04/08/24  5:16 PM   Specimen: Stool  Result Value Ref Range Status   C Diff antigen NEGATIVE NEGATIVE Final   C Diff toxin NEGATIVE NEGATIVE Final   C Diff interpretation No C. difficile detected.  Final    Comment: Performed at [facility], [address] [place], [place] [address]         Radiology Studies: CT ABDOMEN PELVIS WO CONTRAST Result Date: 04/08/2024 CLINICAL DATA:  Diarrhea EXAM: CT ABDOMEN AND PELVIS WITHOUT CONTRAST TECHNIQUE: Multidetector CT imaging of the abdomen and pelvis was performed following the standard protocol without IV contrast. RADIATION DOSE REDUCTION: This exam was performed according to the departmental dose-optimization program which includes automated exposure control, adjustment of the mA and/or kV according to patient size and/or use of iterative reconstruction technique. COMPARISON:  CT 07/23/2020 FINDINGS: Lower chest: Lung bases demonstrate no acute airspace disease. Bandlike atelectasis or scarring at the lingula, right middle lobe and lung bases. Hepatobiliary: No focal liver abnormality is seen. No gallstones, gallbladder wall thickening, or biliary dilatation. Pancreas: Unremarkable. No pancreatic ductal dilatation or surrounding inflammatory changes. Spleen: Normal in size without focal abnormality. Adrenals/Urinary Tract: Adrenal glands are unremarkable. Kidneys are normal, without renal calculi, focal lesion, or  hydronephrosis. Bladder is unremarkable. Stomach/Bowel: Stomach within normal limits. Diffuse fluid-filled nondilated small bowel. Diffuse fluid within the colon. No acute bowel wall thickening. Upper normal size of appendix measuring up to 7 mm which is also fluid-filled but no convincing Peri appendiceal stranding. Vascular/Lymphatic: Aortic atherosclerosis. No enlarged abdominal or pelvic lymph nodes. Reproductive: Status post hysterectomy. No adnexal masses. Other: No ascites or free air Musculoskeletal: Artifact from right hip arthroplasty. No acute or suspicious osseous abnormality IMPRESSION: 1. Diffuse fluid-filled nondilated small bowel and colon, findings are suggestive of enteritis or ileus. No evidence for bowel obstruction or bowel wall thickening. 2. Upper normal size of appendix that is diffusely fluid-filled, but no definitive periappendiceal stranding to suggest active inflammation 3. Aortic atherosclerosis. Aortic Atherosclerosis (ICD10-I70.0). Electronically Signed   By: Luke Bun M.D.   On: 04/08/2024 19:10        Scheduled Meds:  amLODipine   5 mg Oral Daily   heparin   5,000 Units Subcutaneous Q8H   potassium chloride   20 mEq Oral BID   Continuous Infusions:  lactated ringers        LOS: 0 days    Time spent: 35 minutes    Renato Applebaum, MD [facility]   "

## 2024-04-09 NOTE — Care Management Obs Status (Signed)
 MEDICARE OBSERVATION STATUS NOTIFICATION   Patient Details  Name: Victoria Lyons MRN: [id number] Date of Birth: January 17, 1941   Medicare Observation Status Notification Given:  Yes    Sonda Manuella Quill, RN 04/09/2024, 3:28 PM

## 2024-04-09 NOTE — Plan of Care (Signed)
   Problem: Health Behavior/Discharge Planning: Goal: Ability to manage health-related needs will improve Outcome: Progressing   Problem: Clinical Measurements: Goal: Ability to maintain clinical measurements within normal limits will improve Outcome: Progressing Goal: Will remain free from infection Outcome: Progressing

## 2024-04-09 NOTE — TOC Initial Note (Incomplete)
 Transition of Care [facility]) - Initial/Assessment Note    Patient Details  Name: Victoria Lyons MRN: [id number] Date of Birth: 10-Jul-1940  [facility]) CM/SW Contact:    Sonda Manuella Quill, RN Phone Number: 04/09/2024, 3:56 PM  Clinical Narrative:                 [place] w/ pt and family in room; pt said she lives at home; she plans to return w/ family support at d/c; she identified POC son Jullie Arps [phone]); insurance/PCP verified; she denied SDOH risks; pt does not have DME, HH services, or home oxygen; IP CM will follow.  Expected Discharge Plan: Home/Self Care Barriers to Discharge: Continued Medical Work up   Patient Goals and CMS Choice Patient states their goals for this hospitalization and ongoing recovery are:: home     [place] ownership interest in [facility].provided to:: Patient    Expected Discharge Plan and Services   Discharge Planning Services: CM Consult   Living arrangements for the past 2 months: Single Family Home                 DME Arranged: N/A DME Agency: NA       HH Arranged: NA HH Agency: NA        Prior Living Arrangements/Services Living arrangements for the past 2 months: Single Family Home Lives with:: Self Patient language and need for interpreter reviewed:: Yes Do you feel safe going back to the place where you live?: Yes      Need for Family Participation in Patient Care: Yes (Comment) Care giver support system in place?: Yes (comment) Current home services:  (n/a) Criminal Activity/Legal Involvement Pertinent to Current Situation/Hospitalization: No - Comment as needed  Activities of Daily Living   ADL Screening (condition at time of admission) Independently performs ADLs?: Yes (appropriate for developmental age) Is the patient deaf or have difficulty hearing?: No Does the patient have difficulty seeing, even when wearing glasses/contacts?: Yes Does the patient have difficulty  concentrating, remembering, or making decisions?: No  Permission Sought/Granted Permission sought to share information with : Case Manager Permission granted to share information with : Yes, Verbal Permission Granted  Share Information with NAME: Case Manager     Permission granted to share info w Relationship: Analaya Hoey (son) [phone]     Emotional Assessment Appearance:: Appears stated age Attitude/Demeanor/Rapport: Gracious Affect (typically observed): Accepting Orientation: : Oriented to Self, Oriented to Place, Oriented to  Time, Oriented to Situation Alcohol / Substance Use: Not Applicable Psych Involvement: No (comment)  Admission diagnosis:  AKI (acute kidney injury) [N17.9] Nausea vomiting and diarrhea [R11.2, R19.7] Patient Active Problem List   Diagnosis Date Noted   Vomiting and diarrhea 04/09/2024   Hypertension    Acute renal failure superimposed on stage 3a chronic kidney disease (HCC) 04/08/2024   Osteoarthritis of right hip 09/26/2019   S/P hip replacement, right 09/26/2019   Status post hip replacement, right 09/26/2019   PCP:  Douglass Gerard VEAR, PA-C Pharmacy:   [id number] - [place], [place] - [address] [place] [address] Phone: [phone] Fax: [phone]  [facility] [place] [facility] [address] [place] [address] Phone: [phone] Fax: [phone]  [facility] [address] [place], [place] - [address] [place] [address] Phone: [phone] Fax: [phone]     Social Drivers of Health (  SDOH) Social History: SDOH Screenings   Food Insecurity: No Food Insecurity (04/09/2024)  Housing: Low Risk (04/09/2024)  Transportation Needs: No Transportation Needs (04/09/2024)  Utilities: Not At Risk (04/09/2024)  Social Connections: Moderately Integrated (04/09/2024)  Tobacco Use: Low Risk (04/09/2024)   SDOH  Interventions: Food Insecurity Interventions: Intervention Not Indicated, Inpatient TOC Housing Interventions: Intervention Not Indicated, Inpatient TOC Transportation Interventions: Intervention Not Indicated, Inpatient TOC Utilities Interventions: Intervention Not Indicated, Inpatient TOC   Readmission Risk Interventions     No data to display

## 2024-04-10 DIAGNOSIS — R1110 Vomiting, unspecified: Secondary | ICD-10-CM | POA: Diagnosis not present

## 2024-04-10 DIAGNOSIS — N170 Acute kidney failure with tubular necrosis: Secondary | ICD-10-CM | POA: Diagnosis not present

## 2024-04-10 DIAGNOSIS — N1831 Chronic kidney disease, stage 3a: Secondary | ICD-10-CM | POA: Diagnosis not present

## 2024-04-10 DIAGNOSIS — I10 Essential (primary) hypertension: Secondary | ICD-10-CM | POA: Diagnosis not present

## 2024-04-10 LAB — BASIC METABOLIC PANEL WITH GFR
Anion gap: 12 (ref 5–15)
BUN: 25 mg/dL — ABNORMAL HIGH (ref 8–23)
CO2: 19 mmol/L — ABNORMAL LOW (ref 22–32)
Calcium: 9 mg/dL (ref 8.9–10.3)
Chloride: 108 mmol/L (ref 98–111)
Creatinine, Ser: 1.36 mg/dL — ABNORMAL HIGH (ref 0.44–1.00)
GFR, Estimated: 39 mL/min — ABNORMAL LOW
Glucose, Bld: 94 mg/dL (ref 70–99)
Potassium: 3.1 mmol/L — ABNORMAL LOW (ref 3.5–5.1)
Sodium: 138 mmol/L (ref 135–145)

## 2024-04-10 LAB — PHOSPHORUS: Phosphorus: 1.7 mg/dL — ABNORMAL LOW (ref 2.5–4.6)

## 2024-04-10 LAB — MAGNESIUM: Magnesium: 1.8 mg/dL (ref 1.7–2.4)

## 2024-04-10 MED ORDER — LACTATED RINGERS IV SOLN: INTRAVENOUS | Status: AC

## 2024-04-10 MED ORDER — POTASSIUM CHLORIDE CRYS ER 20 MEQ PO TBCR
40.0000 meq | EXTENDED_RELEASE_TABLET | Freq: Two times a day (BID) | ORAL | Status: DC
  Administered 2024-04-10 (×2): 40 meq via ORAL
  Filled 2024-04-10 (×3): qty 2

## 2024-04-10 MED ORDER — POTASSIUM PHOSPHATES 15 MMOLE/5ML IV SOLN
30.0000 mmol | Freq: Once | INTRAVENOUS | Status: AC
  Administered 2024-04-10: 30 mmol via INTRAVENOUS
  Filled 2024-04-10: qty 10

## 2024-04-10 NOTE — Progress Notes (Signed)
 " PROGRESS NOTE    Victoria Lyons  FMW:[id number] DOB: 09-04-1940 DOA: 04/08/2024 PCP: Douglass Gerard DEL, PA-C    Brief Narrative:  83 year old with history of hypertension, glaucoma and vertigo presented with about 6 days of nausea, poor appetite, ongoing watery diarrhea.  Felt dehydrated.  In the emergency room blood pressure stable.  Afebrile.  On room air.  BUN 32, creatinine 2 with recently normal creatinine.  Admitted due to AKI, significant dehydration.  Subjective: Patient is seen and examined. Appetite is fair but she is anxious today. Has some abdominal cramps , had 7 loose BM and not slowing down.   Assessment & Plan:   Persistent diarrhea and nausea: Likely infective gastroenteritis.  Significantly dehydrated. Encourage oral intake and liquid intake Continue maintenance IV fluids today Intake output monitoring C. difficile negative.  GI pathogen panel negative.  Can use Imodium  every 4 hours.  Hypokalemia: Still low.  Continue replacement and monitoring.  Hypophosphatemia, replace and monitor.  AKI: Due to above.  Already improving.  Encourage oral intake.  Continue maintenance fluids.  Recheck levels tomorrow morning.  Hypertension: Blood pressure stable.  Continue amlodipine .    DVT prophylaxis: heparin  injection 5,000 Units Start: 04/09/24 0600   Code Status: Full code Family Communication: None at the bedside Disposition Plan: Status is: Observation The patient will require care spanning > 2 midnights and should be moved to inpatient because: Significantly dehydrated, needs monitoring, IV fluids     Consultants:  None  Procedures:  None  Antimicrobials:  None     Objective: Vitals:   04/09/24 1716 04/09/24 2154 04/10/24 0443 04/10/24 0932  BP: (!) 113/57 (!) 117/55 (!) 116/54 (!) 115/58  Pulse: 95 (!) 101 89   Resp: 18 14 14    Temp: 98.8 F (37.1 C) 98.7 F (37.1 C) 98.8 F (37.1 C)   TempSrc:   Oral   SpO2: 98% 98%    Weight:       Height:        Intake/Output Summary (Last 24 hours) at 04/10/2024 1402 Last data filed at 04/09/2024 1800 Gross per 24 hour  Intake 520 ml  Output --  Net 520 ml   Filed Weights   04/08/24 1636 04/08/24 2312  Weight: 69.4 kg 73.3 kg    Examination:  General exam: Looks comfortable.  Slightly anxious. Respiratory system: Clear to auscultation. Respiratory effort normal. Cardiovascular system: S1 & S2 heard, RRR.  Gastrointestinal system: Soft.  Nontender.  Bowel sound present. Central nervous system: Alert and oriented. No focal neurological deficits. Extremities: Symmetric 5 x 5 power.    Data Reviewed: I have personally reviewed following labs and imaging studies  CBC: Recent Labs  Lab 04/08/24 1716 04/09/24 0622  WBC 8.0 7.2  NEUTROABS 5.7  --   HGB 13.4 12.0  HCT 41.6 37.1  MCV 83.7 84.3  PLT 281 246   Basic Metabolic Panel: Recent Labs  Lab 04/08/24 1716 04/09/24 0622 04/10/24 0450  NA 141 141 138  K 3.7 3.3* 3.1*  CL 103 109 108  CO2 21* 19* 19*  GLUCOSE 107* 112* 94  BUN 32* 30* 25*  CREATININE 2.00* 1.47* 1.36*  CALCIUM 9.8 8.7* 9.0  MG  --   --  1.8  PHOS  --   --  1.7*   GFR: Estimated Creatinine Clearance: 33.2 mL/min (A) (by C-G formula based on SCr of 1.36 mg/dL (H)). Liver Function Tests: Recent Labs  Lab 04/08/24 1716  AST 18  ALT 8  ALKPHOS 95  BILITOT 0.6  PROT 8.0  ALBUMIN 4.7   Recent Labs  Lab 04/08/24 1716  LIPASE 15   No results for input(s): AMMONIA in the last 168 hours. Coagulation Profile: No results for input(s): INR, PROTIME in the last 168 hours. Cardiac Enzymes: No results for input(s): CKTOTAL, CKMB, CKMBINDEX, TROPONINI in the last 168 hours. BNP (last 3 results) No results for input(s): PROBNP in the last 8760 hours. HbA1C: No results for input(s): HGBA1C in the last 72 hours. CBG: Recent Labs  Lab 04/09/24 0753  GLUCAP 97   Lipid Profile: No results for input(s): CHOL,  HDL, LDLCALC, TRIG, CHOLHDL, LDLDIRECT in the last 72 hours. Thyroid Function Tests: No results for input(s): TSH, T4TOTAL, FREET4, T3FREE, THYROIDAB in the last 72 hours. Anemia Panel: No results for input(s): VITAMINB12, FOLATE, FERRITIN, TIBC, IRON, RETICCTPCT in the last 72 hours. Sepsis Labs: No results for input(s): PROCALCITON, LATICACIDVEN in the last 168 hours.  Recent Results (from the past 240 hours)  Gastrointestinal Panel by PCR , Stool     Status: None   Collection Time: 04/08/24  5:16 PM   Specimen: Stool  Result Value Ref Range Status   Campylobacter species NOT DETECTED NOT DETECTED Final   Plesimonas shigelloides NOT DETECTED NOT DETECTED Final   Salmonella species NOT DETECTED NOT DETECTED Final   Yersinia enterocolitica NOT DETECTED NOT DETECTED Final   Vibrio species NOT DETECTED NOT DETECTED Final   Vibrio cholerae NOT DETECTED NOT DETECTED Final   Enteroaggregative E coli (EAEC) NOT DETECTED NOT DETECTED Final   Enteropathogenic E coli (EPEC) NOT DETECTED NOT DETECTED Final   Enterotoxigenic E coli (ETEC) NOT DETECTED NOT DETECTED Final   Shiga like toxin producing E coli (STEC) NOT DETECTED NOT DETECTED Final   Shigella/Enteroinvasive E coli (EIEC) NOT DETECTED NOT DETECTED Final   Cryptosporidium NOT DETECTED NOT DETECTED Final   Cyclospora cayetanensis NOT DETECTED NOT DETECTED Final   Entamoeba histolytica NOT DETECTED NOT DETECTED Final   Giardia lamblia NOT DETECTED NOT DETECTED Final   Adenovirus F40/41 NOT DETECTED NOT DETECTED Final   Astrovirus NOT DETECTED NOT DETECTED Final   Norovirus GI/GII NOT DETECTED NOT DETECTED Final   Rotavirus A NOT DETECTED NOT DETECTED Final   Sapovirus (I, II, IV, and V) NOT DETECTED NOT DETECTED Final    Comment: Performed at [facility], [address] [place], [place] [address]  C Difficile Quick Screen w PCR reflex     Status: None   Collection Time: 04/08/24  5:16 PM    Specimen: Stool  Result Value Ref Range Status   C Diff antigen NEGATIVE NEGATIVE Final   C Diff toxin NEGATIVE NEGATIVE Final   C Diff interpretation No C. difficile detected.  Final    Comment: Performed at [facility], [address] [place], [place] [address]         Radiology Studies: CT ABDOMEN PELVIS WO CONTRAST Result Date: 04/08/2024 CLINICAL DATA:  Diarrhea EXAM: CT ABDOMEN AND PELVIS WITHOUT CONTRAST TECHNIQUE: Multidetector CT imaging of the abdomen and pelvis was performed following the standard protocol without IV contrast. RADIATION DOSE REDUCTION: This exam was performed according to the departmental dose-optimization program which includes automated exposure control, adjustment of the mA and/or kV according to patient size and/or use of iterative reconstruction technique. COMPARISON:  CT 07/23/2020 FINDINGS: Lower chest: Lung bases demonstrate no acute airspace disease. Bandlike atelectasis or scarring at the lingula, right middle lobe and lung bases. Hepatobiliary:  No focal liver abnormality is seen. No gallstones, gallbladder wall thickening, or biliary dilatation. Pancreas: Unremarkable. No pancreatic ductal dilatation or surrounding inflammatory changes. Spleen: Normal in size without focal abnormality. Adrenals/Urinary Tract: Adrenal glands are unremarkable. Kidneys are normal, without renal calculi, focal lesion, or hydronephrosis. Bladder is unremarkable. Stomach/Bowel: Stomach within normal limits. Diffuse fluid-filled nondilated small bowel. Diffuse fluid within the colon. No acute bowel wall thickening. Upper normal size of appendix measuring up to 7 mm which is also fluid-filled but no convincing Peri appendiceal stranding. Vascular/Lymphatic: Aortic atherosclerosis. No enlarged abdominal or pelvic lymph nodes. Reproductive: Status post hysterectomy. No adnexal masses. Other: No ascites or free air Musculoskeletal: Artifact from right hip arthroplasty. No acute  or suspicious osseous abnormality IMPRESSION: 1. Diffuse fluid-filled nondilated small bowel and colon, findings are suggestive of enteritis or ileus. No evidence for bowel obstruction or bowel wall thickening. 2. Upper normal size of appendix that is diffusely fluid-filled, but no definitive periappendiceal stranding to suggest active inflammation 3. Aortic atherosclerosis. Aortic Atherosclerosis (ICD10-I70.0). Electronically Signed   By: Luke Bun M.D.   On: 04/08/2024 19:10        Scheduled Meds:  amLODipine   5 mg Oral Daily   heparin   5,000 Units Subcutaneous Q8H   potassium chloride   40 mEq Oral BID   Continuous Infusions:  lactated ringers      potassium PHOSPHATE  IVPB (in mmol) 30 mmol (04/10/24 0939)     LOS: 0 days    Time spent: 35 minutes    Renato Applebaum, MD [facility]   "

## 2024-04-11 DIAGNOSIS — I129 Hypertensive chronic kidney disease with stage 1 through stage 4 chronic kidney disease, or unspecified chronic kidney disease: Secondary | ICD-10-CM | POA: Diagnosis present

## 2024-04-11 DIAGNOSIS — E785 Hyperlipidemia, unspecified: Secondary | ICD-10-CM | POA: Diagnosis present

## 2024-04-11 DIAGNOSIS — Z90710 Acquired absence of both cervix and uterus: Secondary | ICD-10-CM | POA: Diagnosis not present

## 2024-04-11 DIAGNOSIS — E663 Overweight: Secondary | ICD-10-CM | POA: Diagnosis present

## 2024-04-11 DIAGNOSIS — A084 Viral intestinal infection, unspecified: Secondary | ICD-10-CM | POA: Diagnosis present

## 2024-04-11 DIAGNOSIS — Z96641 Presence of right artificial hip joint: Secondary | ICD-10-CM | POA: Diagnosis present

## 2024-04-11 DIAGNOSIS — N170 Acute kidney failure with tubular necrosis: Secondary | ICD-10-CM | POA: Diagnosis not present

## 2024-04-11 DIAGNOSIS — E876 Hypokalemia: Secondary | ICD-10-CM | POA: Diagnosis present

## 2024-04-11 DIAGNOSIS — R197 Diarrhea, unspecified: Secondary | ICD-10-CM | POA: Diagnosis not present

## 2024-04-11 DIAGNOSIS — N179 Acute kidney failure, unspecified: Secondary | ICD-10-CM | POA: Diagnosis present

## 2024-04-11 DIAGNOSIS — Z888 Allergy status to other drugs, medicaments and biological substances status: Secondary | ICD-10-CM | POA: Diagnosis not present

## 2024-04-11 DIAGNOSIS — Z6824 Body mass index (BMI) 24.0-24.9, adult: Secondary | ICD-10-CM | POA: Diagnosis not present

## 2024-04-11 DIAGNOSIS — Z79899 Other long term (current) drug therapy: Secondary | ICD-10-CM | POA: Diagnosis not present

## 2024-04-11 DIAGNOSIS — D631 Anemia in chronic kidney disease: Secondary | ICD-10-CM | POA: Diagnosis present

## 2024-04-11 DIAGNOSIS — E8809 Other disorders of plasma-protein metabolism, not elsewhere classified: Secondary | ICD-10-CM | POA: Diagnosis present

## 2024-04-11 DIAGNOSIS — E872 Acidosis, unspecified: Secondary | ICD-10-CM | POA: Diagnosis present

## 2024-04-11 DIAGNOSIS — Z91040 Latex allergy status: Secondary | ICD-10-CM | POA: Diagnosis not present

## 2024-04-11 DIAGNOSIS — R1110 Vomiting, unspecified: Secondary | ICD-10-CM | POA: Diagnosis not present

## 2024-04-11 DIAGNOSIS — E039 Hypothyroidism, unspecified: Secondary | ICD-10-CM | POA: Diagnosis present

## 2024-04-11 DIAGNOSIS — N281 Cyst of kidney, acquired: Secondary | ICD-10-CM | POA: Diagnosis present

## 2024-04-11 DIAGNOSIS — N1330 Unspecified hydronephrosis: Secondary | ICD-10-CM | POA: Diagnosis present

## 2024-04-11 DIAGNOSIS — H409 Unspecified glaucoma: Secondary | ICD-10-CM | POA: Diagnosis present

## 2024-04-11 DIAGNOSIS — N1831 Chronic kidney disease, stage 3a: Secondary | ICD-10-CM | POA: Diagnosis not present

## 2024-04-11 DIAGNOSIS — E860 Dehydration: Secondary | ICD-10-CM | POA: Diagnosis present

## 2024-04-11 DIAGNOSIS — I10 Essential (primary) hypertension: Secondary | ICD-10-CM | POA: Diagnosis not present

## 2024-04-11 DIAGNOSIS — N1832 Chronic kidney disease, stage 3b: Secondary | ICD-10-CM | POA: Diagnosis present

## 2024-04-11 DIAGNOSIS — Z881 Allergy status to other antibiotic agents status: Secondary | ICD-10-CM | POA: Diagnosis not present

## 2024-04-11 DIAGNOSIS — I700 Atherosclerosis of aorta: Secondary | ICD-10-CM | POA: Diagnosis present

## 2024-04-11 LAB — BASIC METABOLIC PANEL WITH GFR
Anion gap: 12 (ref 5–15)
BUN: 18 mg/dL (ref 8–23)
CO2: 20 mmol/L — ABNORMAL LOW (ref 22–32)
Calcium: 9 mg/dL (ref 8.9–10.3)
Chloride: 106 mmol/L (ref 98–111)
Creatinine, Ser: 1.34 mg/dL — ABNORMAL HIGH (ref 0.44–1.00)
GFR, Estimated: 40 mL/min — ABNORMAL LOW
Glucose, Bld: 104 mg/dL — ABNORMAL HIGH (ref 70–99)
Potassium: 3.3 mmol/L — ABNORMAL LOW (ref 3.5–5.1)
Sodium: 138 mmol/L (ref 135–145)

## 2024-04-11 LAB — MAGNESIUM: Magnesium: 1.8 mg/dL (ref 1.7–2.4)

## 2024-04-11 LAB — PHOSPHORUS: Phosphorus: 2 mg/dL — ABNORMAL LOW (ref 2.5–4.6)

## 2024-04-11 MED ORDER — POTASSIUM CHLORIDE 20 MEQ PO PACK
20.0000 meq | PACK | Freq: Two times a day (BID) | ORAL | Status: DC
  Administered 2024-04-11: 20 meq via ORAL
  Filled 2024-04-11 (×3): qty 1

## 2024-04-11 MED ORDER — POTASSIUM PHOSPHATES 15 MMOLE/5ML IV SOLN
15.0000 mmol | Freq: Once | INTRAVENOUS | Status: AC
  Administered 2024-04-11: 15 mmol via INTRAVENOUS
  Filled 2024-04-11: qty 5

## 2024-04-11 MED ORDER — POTASSIUM CHLORIDE CRYS ER 20 MEQ PO TBCR
20.0000 meq | EXTENDED_RELEASE_TABLET | Freq: Once | ORAL | Status: AC
  Administered 2024-04-11: 20 meq via ORAL
  Filled 2024-04-11: qty 1

## 2024-04-11 NOTE — Progress Notes (Signed)
 " PROGRESS NOTE    Victoria Lyons  FMW:[id number] DOB: September 27, 1940 DOA: 04/08/2024 PCP: Douglass Gerard DEL, PA-C    Brief Narrative:  83 year old with history of hypertension, glaucoma and vertigo presented with about 6 days of nausea, poor appetite, ongoing watery diarrhea.  Felt dehydrated.  In the emergency room blood pressure stable.  Afebrile.  On room air.  BUN 32, creatinine 2 with recently normal creatinine.  Admitted due to AKI, significant dehydration.  Subjective:  Patient seen and examined.  Does have mild suprapubic pain and cramping.  Still has significant diarrhea.  Patient reported 6 episodes since morning, nurses did not report this.  Does not have nausea but also does not have any appetite.  Assessment & Plan:   Persistent diarrhea and nausea: Likely infective gastroenteritis.  Significantly dehydrated. Encourage oral intake and liquid intake Continue maintenance IV fluids today as she is high risk of dehydration and decompensation. Intake output monitoring C. difficile negative.  GI pathogen panel negative.  Can use Imodium  every 4 hours.  Hypokalemia: Still low.  Continue replacement and monitoring.  Hypophosphatemia, replace and monitor.  Replaced today.  AKI: Due to above.  Already improving.  Encourage oral intake.  Continue maintenance fluids.    Hypertension: Blood pressure stable.  Holding antihypertensives to avoid hypotension.    DVT prophylaxis: heparin  injection 5,000 Units Start: 04/09/24 0600   Code Status: Full code Family Communication: None at the bedside.  Patient's son called and updated 12/22. Disposition Plan: Status is: inpatient.  Significant symptoms.  IV fluids     Consultants:  None  Procedures:  None  Antimicrobials:  None     Objective: Vitals:   04/10/24 1957 04/10/24 1959 04/11/24 0425 04/11/24 1237  BP: (!) 153/64 134/62 (!) 111/56 (!) 119/59  Pulse: (!) 111 (!) 109  (!) 106  Resp: 15 15 18 16   Temp: 98.4 F (36.9  C)  98.5 F (36.9 C) 98.8 F (37.1 C)  TempSrc: Oral  Oral Oral  SpO2:      Weight:      Height:        Intake/Output Summary (Last 24 hours) at 04/11/2024 1413 Last data filed at 04/11/2024 9392 Gross per 24 hour  Intake 1981.73 ml  Output --  Net 1981.73 ml   Filed Weights   04/08/24 1636 04/08/24 2312  Weight: 69.4 kg 73.3 kg    Examination:  General exam: Slightly anxious.  Not in any distress.  Pleasant to interaction. Respiratory system: Clear to auscultation. Respiratory effort normal. Cardiovascular system: S1 & S2 heard, RRR.  Gastrointestinal system: Soft.  Nontender.  Bowel sound present. Central nervous system: Alert and oriented. No focal neurological deficits. Extremities: Symmetric 5 x 5 power.    Data Reviewed: I have personally reviewed following labs and imaging studies  CBC: Recent Labs  Lab 04/08/24 1716 04/09/24 0622  WBC 8.0 7.2  NEUTROABS 5.7  --   HGB 13.4 12.0  HCT 41.6 37.1  MCV 83.7 84.3  PLT 281 246   Basic Metabolic Panel: Recent Labs  Lab 04/08/24 1716 04/09/24 0622 04/10/24 0450 04/11/24 0559  NA 141 141 138 138  K 3.7 3.3* 3.1* 3.3*  CL 103 109 108 106  CO2 21* 19* 19* 20*  GLUCOSE 107* 112* 94 104*  BUN 32* 30* 25* 18  CREATININE 2.00* 1.47* 1.36* 1.34*  CALCIUM 9.8 8.7* 9.0 9.0  MG  --   --  1.8 1.8  PHOS  --   --  1.7* 2.0*   GFR: Estimated Creatinine Clearance: 33.7 mL/min (A) (by C-G formula based on SCr of 1.34 mg/dL (H)). Liver Function Tests: Recent Labs  Lab 04/08/24 1716  AST 18  ALT 8  ALKPHOS 95  BILITOT 0.6  PROT 8.0  ALBUMIN 4.7   Recent Labs  Lab 04/08/24 1716  LIPASE 15   No results for input(s): AMMONIA in the last 168 hours. Coagulation Profile: No results for input(s): INR, PROTIME in the last 168 hours. Cardiac Enzymes: No results for input(s): CKTOTAL, CKMB, CKMBINDEX, TROPONINI in the last 168 hours. BNP (last 3 results) No results for input(s): PROBNP in the  last 8760 hours. HbA1C: No results for input(s): HGBA1C in the last 72 hours. CBG: Recent Labs  Lab 04/09/24 0753  GLUCAP 97   Lipid Profile: No results for input(s): CHOL, HDL, LDLCALC, TRIG, CHOLHDL, LDLDIRECT in the last 72 hours. Thyroid Function Tests: No results for input(s): TSH, T4TOTAL, FREET4, T3FREE, THYROIDAB in the last 72 hours. Anemia Panel: No results for input(s): VITAMINB12, FOLATE, FERRITIN, TIBC, IRON, RETICCTPCT in the last 72 hours. Sepsis Labs: No results for input(s): PROCALCITON, LATICACIDVEN in the last 168 hours.  Recent Results (from the past 240 hours)  Gastrointestinal Panel by PCR , Stool     Status: None   Collection Time: 04/08/24  5:16 PM   Specimen: Stool  Result Value Ref Range Status   Campylobacter species NOT DETECTED NOT DETECTED Final   Plesimonas shigelloides NOT DETECTED NOT DETECTED Final   Salmonella species NOT DETECTED NOT DETECTED Final   Yersinia enterocolitica NOT DETECTED NOT DETECTED Final   Vibrio species NOT DETECTED NOT DETECTED Final   Vibrio cholerae NOT DETECTED NOT DETECTED Final   Enteroaggregative E coli (EAEC) NOT DETECTED NOT DETECTED Final   Enteropathogenic E coli (EPEC) NOT DETECTED NOT DETECTED Final   Enterotoxigenic E coli (ETEC) NOT DETECTED NOT DETECTED Final   Shiga like toxin producing E coli (STEC) NOT DETECTED NOT DETECTED Final   Shigella/Enteroinvasive E coli (EIEC) NOT DETECTED NOT DETECTED Final   Cryptosporidium NOT DETECTED NOT DETECTED Final   Cyclospora cayetanensis NOT DETECTED NOT DETECTED Final   Entamoeba histolytica NOT DETECTED NOT DETECTED Final   Giardia lamblia NOT DETECTED NOT DETECTED Final   Adenovirus F40/41 NOT DETECTED NOT DETECTED Final   Astrovirus NOT DETECTED NOT DETECTED Final   Norovirus GI/GII NOT DETECTED NOT DETECTED Final   Rotavirus A NOT DETECTED NOT DETECTED Final   Sapovirus (I, II, IV, and V) NOT DETECTED NOT DETECTED Final     Comment: Performed at [facility], [address] [place], [place] [address]  C Difficile Quick Screen w PCR reflex     Status: None   Collection Time: 04/08/24  5:16 PM   Specimen: Stool  Result Value Ref Range Status   C Diff antigen NEGATIVE NEGATIVE Final   C Diff toxin NEGATIVE NEGATIVE Final   C Diff interpretation No C. difficile detected.  Final    Comment: Performed at [facility], [address], [place], [place] [address]         Radiology Studies: No results found.       Scheduled Meds:  heparin   5,000 Units Subcutaneous Q8H   potassium chloride   20 mEq Oral BID   Continuous Infusions:  lactated ringers  100 mL/hr at 04/10/24 1737   potassium PHOSPHATE  IVPB (in mmol) 15 mmol (04/11/24 1101)     LOS: 0 days    Time spent:  35 minutes    Renato Applebaum, MD [facility]   "

## 2024-04-11 NOTE — Progress Notes (Signed)
" °   04/11/24 0913  TOC Brief Assessment  Insurance and Status Reviewed  Patient has primary care physician Yes  Home environment has been reviewed single family home  Prior level of function: independent  Prior/Current Home Services No current home services  Social Drivers of Health Review SDOH reviewed no interventions necessary  Readmission risk has been reviewed Yes  Transition of care needs transition of care needs identified, TOC will continue to follow    Signed: Heather Saltness, MSW, LCSW Clinical Social Worker Inpatient Care Management 04/11/2024 9:13 AM   "

## 2024-04-12 DIAGNOSIS — N1831 Chronic kidney disease, stage 3a: Secondary | ICD-10-CM | POA: Diagnosis not present

## 2024-04-12 DIAGNOSIS — I10 Essential (primary) hypertension: Secondary | ICD-10-CM | POA: Diagnosis not present

## 2024-04-12 DIAGNOSIS — R197 Diarrhea, unspecified: Secondary | ICD-10-CM | POA: Diagnosis not present

## 2024-04-12 DIAGNOSIS — R1110 Vomiting, unspecified: Secondary | ICD-10-CM | POA: Diagnosis not present

## 2024-04-12 DIAGNOSIS — N179 Acute kidney failure, unspecified: Secondary | ICD-10-CM | POA: Diagnosis not present

## 2024-04-12 LAB — CBC WITH DIFFERENTIAL/PLATELET
Abs Immature Granulocytes: 0.34 K/uL — ABNORMAL HIGH (ref 0.00–0.07)
Basophils Absolute: 0.1 K/uL (ref 0.0–0.1)
Basophils Relative: 1 %
Eosinophils Absolute: 0.1 K/uL (ref 0.0–0.5)
Eosinophils Relative: 1 %
HCT: 36.5 % (ref 36.0–46.0)
Hemoglobin: 12.1 g/dL (ref 12.0–15.0)
Immature Granulocytes: 3 %
Lymphocytes Relative: 30 %
Lymphs Abs: 3.4 K/uL (ref 0.7–4.0)
MCH: 27.1 pg (ref 26.0–34.0)
MCHC: 33.2 g/dL (ref 30.0–36.0)
MCV: 81.7 fL (ref 80.0–100.0)
Monocytes Absolute: 1 K/uL (ref 0.1–1.0)
Monocytes Relative: 9 %
Neutro Abs: 6.3 K/uL (ref 1.7–7.7)
Neutrophils Relative %: 56 %
Platelets: 272 K/uL (ref 150–400)
RBC: 4.47 MIL/uL (ref 3.87–5.11)
RDW: 14.6 % (ref 11.5–15.5)
Smear Review: NORMAL
WBC: 11.2 K/uL — ABNORMAL HIGH (ref 4.0–10.5)
nRBC: 0 % (ref 0.0–0.2)

## 2024-04-12 LAB — PHOSPHORUS: Phosphorus: 2.9 mg/dL (ref 2.5–4.6)

## 2024-04-12 LAB — COMPREHENSIVE METABOLIC PANEL WITH GFR
ALT: 7 U/L (ref 0–44)
AST: 16 U/L (ref 15–41)
Albumin: 3.7 g/dL (ref 3.5–5.0)
Alkaline Phosphatase: 92 U/L (ref 38–126)
Anion gap: 14 (ref 5–15)
BUN: 22 mg/dL (ref 8–23)
CO2: 17 mmol/L — ABNORMAL LOW (ref 22–32)
Calcium: 9.6 mg/dL (ref 8.9–10.3)
Chloride: 108 mmol/L (ref 98–111)
Creatinine, Ser: 1.48 mg/dL — ABNORMAL HIGH (ref 0.44–1.00)
GFR, Estimated: 35 mL/min — ABNORMAL LOW
Glucose, Bld: 106 mg/dL — ABNORMAL HIGH (ref 70–99)
Potassium: 3.1 mmol/L — ABNORMAL LOW (ref 3.5–5.1)
Sodium: 139 mmol/L (ref 135–145)
Total Bilirubin: 0.4 mg/dL (ref 0.0–1.2)
Total Protein: 7.3 g/dL (ref 6.5–8.1)

## 2024-04-12 LAB — MAGNESIUM: Magnesium: 1.8 mg/dL (ref 1.7–2.4)

## 2024-04-12 MED ORDER — POTASSIUM CHLORIDE CRYS ER 20 MEQ PO TBCR
40.0000 meq | EXTENDED_RELEASE_TABLET | Freq: Two times a day (BID) | ORAL | Status: DC
  Filled 2024-04-12: qty 2

## 2024-04-12 MED ORDER — SODIUM BICARBONATE 650 MG PO TABS
650.0000 mg | ORAL_TABLET | Freq: Three times a day (TID) | ORAL | Status: DC
  Administered 2024-04-12 – 2024-04-14 (×5): 650 mg via ORAL
  Filled 2024-04-12 (×5): qty 1

## 2024-04-12 MED ORDER — MAGNESIUM SULFATE 2 GM/50ML IV SOLN
2.0000 g | Freq: Once | INTRAVENOUS | Status: AC
  Administered 2024-04-12: 2 g via INTRAVENOUS
  Filled 2024-04-12: qty 50

## 2024-04-12 MED ORDER — LACTATED RINGERS IV SOLN: INTRAVENOUS | Status: DC

## 2024-04-12 NOTE — Plan of Care (Signed)
" °  Problem: Education: Goal: Knowledge of General Education information will improve Description: Including pain rating scale, medication(s)/side effects and non-pharmacologic comfort measures Outcome: Progressing   Problem: Health Behavior/Discharge Planning: Goal: Ability to manage health-related needs will improve Outcome: Progressing   Problem: Clinical Measurements: Goal: Ability to maintain clinical measurements within normal limits will improve Outcome: Progressing Goal: Will remain free from infection Outcome: Progressing Goal: Diagnostic test results will improve Outcome: Progressing Goal: Respiratory complications will improve Outcome: Progressing Goal: Cardiovascular complication will be avoided Outcome: Progressing   Problem: Education: Goal: Knowledge of General Education information will improve Description: Including pain rating scale, medication(s)/side effects and non-pharmacologic comfort measures Outcome: Progressing   Problem: Health Behavior/Discharge Planning: Goal: Ability to manage health-related needs will improve Outcome: Progressing   Problem: Clinical Measurements: Goal: Diagnostic test results will improve Outcome: Progressing   Problem: Clinical Measurements: Goal: Respiratory complications will improve Outcome: Progressing   Problem: Elimination: Goal: Will not experience complications related to bowel motility Outcome: Progressing Goal: Will not experience complications related to urinary retention Outcome: Progressing   Problem: Coping: Goal: Level of anxiety will decrease Outcome: Progressing   Problem: Skin Integrity: Goal: Risk for impaired skin integrity will decrease Outcome: Progressing   "

## 2024-04-12 NOTE — Plan of Care (Signed)
  Problem: Clinical Measurements: Goal: Diagnostic test results will improve Outcome: Progressing   Problem: Nutrition: Goal: Adequate nutrition will be maintained Outcome: Progressing   Problem: Elimination: Goal: Will not experience complications related to urinary retention Outcome: Progressing   Problem: Skin Integrity: Goal: Risk for impaired skin integrity will decrease Outcome: Progressing

## 2024-04-12 NOTE — Hospital Course (Addendum)
 The patient is an 83 year old with history of hypertension, glaucoma and vertigo presented with about 6 days of nausea, poor appetite, ongoing watery diarrhea. Felt dehydrated. In the emergency room blood pressure stable. Afebrile. On room air. BUN 32, creatinine 2 with recently normal creatinine. Admitted due to AKI, significant dehydration.  Her nausea vomiting is improving however continues to have some persistent diarrhea.  Likely this was in the setting of infective viral gastroenteritis.  She is improved significantly and felt well and medically stable for discharge and will need to follow-up with PCP within 1 week  Assessment and Plan:  Persistent Diarrhea and Nausea/ Vomiting: Likely Viral infective gastroenteritis.   -CT Abd/Pelvis done and showed Diffuse fluid-filled nondilated small bowel and colon, which was suggestive of enteritis or ileus. Additionally there was No evidence for bowel obstruction or bowel wall thickening but  there was Upper normal size of appendix that is diffusely fluid-filled, but no definitive periappendiceal stranding to suggest active inflammation. Notably there was also Aortic atherosclerosis. -Significantly dehydrated. Nausea and Vomiting improved but still having quite a bit of diarrhea -Encourage oral intake and liquid intake -Resumed and Continue maintenance IV fluids today as she is high risk of dehydration and decompensation. -Intake output monitoring and she is doing quite well now -C. difficile negative.  GI pathogen panel negative.  -WBC Trend:  Recent Labs  Lab 04/08/24 1716 04/09/24 0622 04/12/24 1016 04/13/24 0603 04/14/24 0609  WBC 8.0 7.2 11.2* 9.3 10.9*  -Supportive Care and C/w IVF and Loperamide  2 mg po q4hprn Diarrhea or Loose Stools.  -Antiemetics w/ po/IV Ondansetron  q6hprn Nausea and IV Prochlorperazine  5 mg q6hprn Refractory N/V -He is improved and medically stable for discharge at this time as PT/OT recommended no follow-up -Follow-up  with gastroenterology Dr. Ladora in outpatient setting within 1 to 2 weeks   Hypokalemia: K+ Trend went from 3.3 -> 3.1 -> 3.3 -> 3.1 -> 3.5 and is now 3.4 at the time of discharge. Replete prior to discharge. CTM and Replete as Necessary. Repeat CMP in the AM   Hypophosphatemia: Improved. Phos Level went from 1.7 -> 2.0 -> 2.9 -> 2.3 -> 2.8. Replete w/ po K Phos  Neutral 500 mg po BID x2. CTM and Trend and repeat Phos Level in the AM  Leukocytosis: Likely 2/2 to above. WBC went from 7.2 -> 11.2 -> 9.3 -> 10.9. CTM and Trend and repeat CBC in the AM    AKI on CKD stage 3b: Improved. Baseline Cr used to be to be ~0.9-1.1 however this was earlier this year. May have had progression in Renal Disease. Due to above. BUN/Cr Trend: Recent Labs  Lab 04/08/24 1716 04/09/24 0622 04/10/24 0450 04/11/24 0559 04/12/24 1016 04/13/24 0603 04/14/24 0609  BUN 32* 30* 25* 18 22 23 19   CREATININE 2.00* 1.47* 1.36* 1.34* 1.48* 1.71* 1.22*  -IVF restarted w/ LR @ 100 mL/hr x 1 day and now will change to 75 mL/hr x1 Day -Check Renal U/S and UA given slight worsening in Renal Fxn. U/A only showed hazy appearance with large leukocytes, negative nitrites, rare bacteria, 0-5 squamous epithelial cells, 0-5 RBCs per high-power field, and 11-20 WBCs -Renal ultrasound done showed Bilateral echogenic kidneys which likely represents sequelae associated with medical renal disease as well as a Small simple right renal cyst. -Avoid Nephrotoxic Medications, Contrast Dyes, Hypotension and Dehydration to Ensure Adequate Renal Perfusion and will need to Renally Adjust Meds. CTM & Trend Renal Function carefully & repeat CMP in the AM  Normocytic Anemia: Hgb/Hct Trend:  Recent Labs  Lab 04/08/24 1716 04/09/24 0622 04/12/24 1016 04/13/24 0603 04/14/24 0609  HGB 13.4 12.0 12.1 11.2* 10.9*  HCT 41.6 37.1 36.5 33.7* 32.7*  MCV 83.7 84.3 81.7 81.6 81.5  -Likely dilutional Drop from IVF. CTM for S/Sx of Bleeding; No overt  bleeding noted. Repeat CBC in the AM  Metabolic Acidosis: CO2 is now 20, AG is 12, and Chloride Level is 105. IVF as above. C/w Sodium Bicarbonate  650 mg po TID x 3 Days. CTM and Trend and repeat CMP in the AM.    Essential Hypertension: Blood pressure stable.  Holding antihypertensives to avoid hypotension due to her continued Diarrhea. CTM BP per Protocol. Last BP reading was 125/62  Hypoalbuminemia: Patient's Albumin Lvl went from 4.7 -> 3.7 -> 3.3 and is 3.1 at the time of discharge. CTM & Trend & repeat CMP in the AM

## 2024-04-12 NOTE — Progress Notes (Signed)
 " PROGRESS NOTE    Victoria Lyons  FMW:[id number] DOB: 1940/12/03 DOA: 04/08/2024 PCP: Douglass Gerard DEL, PA-C   Brief Narrative:  The patient is an 83 year old with history of hypertension, glaucoma and vertigo presented with about 6 days of nausea, poor appetite, ongoing watery diarrhea. Felt dehydrated. In the emergency room blood pressure stable. Afebrile. On room air. BUN 32, creatinine 2 with recently normal creatinine. Admitted due to AKI, significant dehydration.  Her nausea vomiting is improving however continues to have some persistent diarrhea.  Likely this was in the setting of infective viral gastroenteritis.  Assessment and Plan:  Persistent Diarrhea and Nausea/ Vomiting: Likely Viral infective gastroenteritis.   -CT Abd/Pelvis done and showed Diffuse fluid-filled nondilated small bowel and colon, which was suggestive of enteritis or ileus. Additionally there was No evidence for bowel obstruction or bowel wall thickening but  there was Upper normal size of appendix that is diffusely fluid-filled, but no definitive periappendiceal stranding to suggest active inflammation. Notably there was also Aortic atherosclerosis. -Significantly dehydrated. Nausea and Vomiting improved but still having quite a bit of diarrhea -Encourage oral intake and liquid intake -Resume and Continue maintenance IV fluids today as she is high risk of dehydration and decompensation. -Intake output monitoring -C. difficile negative.  GI pathogen panel negative.  -Supportive Care and C/w IVF and Loperamide  2 mg po q4hprn Diarrhea or Loose Stools.  -Antiemetics w/ po/IV Ondansetron  q6hprn Nausea and IV Prochlorperazine  5 mg q6hprn Refractory N/V   Hypokalemia: K+ Trend went from 3.3 -> 3.1 -> 3.3 -> 3.1. Replete w/ po KCL 40 mEQ BID x2 and po KCL 20 mEQ BID. Replete Mag of 1.8. CTM and Replete as Necessary. Repeat CMP in the AM   Hypophosphatemia: Improved. Phos Level went from 1.7 -> 2.0 -> 2.9. CTM and Trend  and repeat Phos Level in the AM  Leukocytosis: Likely 2/2 to above. WBC went from 7.2 -> 11.2. CTM and Trend and repeat CBC in the AM    AKI on CKD stage 3b: Baseline Cr appeared to be ~0.9-1.1 however this was earlier this year. May have had progression in Renal Disease. Due to above. BUN/Cr Trend: Recent Labs  Lab 04/08/24 1716 04/09/24 0622 04/10/24 0450 04/11/24 0559 04/12/24 1016  BUN 32* 30* 25* 18 22  CREATININE 2.00* 1.47* 1.36* 1.34* 1.48*  -IVF restarted w/ LR @ 100 mL/hr -Avoid Nephrotoxic Medications, Contrast Dyes, Hypotension and Dehydration to Ensure Adequate Renal Perfusion and will need to Renally Adjust Meds. CTM & Trend Renal Function carefully & repeat CMP in the AM   Metabolic Acidosis: CO2 is now 17, AG is 14, and Chloride Level is 108. IVF as above. Start Sodium Bicarbonate  650 mg po TID. CTM and Trend and repeat CMP in the AM.    Essential Hypertension: Blood pressure stable.  Holding antihypertensives to avoid hypotension due to her continued Diarrhea   DVT prophylaxis: heparin  injection 5,000 Units Start: 04/09/24 0600    Code Status: Full Code Family Communication: No family present @ bedside   Disposition Plan:  Level of care: Med-Surg Status is: Inpatient Remains inpatient appropriate because: Needs further clinical improvement and improvement in diarrhea    Consultants:  None  Procedures:  As delineated as above   Antimicrobials:  Anti-infectives (From admission, onward)    None       Subjective: Seen and examined at bedside she is doing okay and states her nausea and vomiting is improved after taking antiemetics.  Still having some diarrhea  and states this not formed yet.  States that her son will bring her some food that she likes to see if that will help.  No other concerns or complaints at this time.  Objective: Vitals:   04/11/24 1237 04/11/24 1919 04/12/24 0423 04/12/24 1444  BP: (!) 119/59 (!) 157/82 132/64 139/69  Pulse: (!) 106  (!) 105 100 (!) 105  Resp: 16 18 20 16   Temp: 98.8 F (37.1 C) 99.6 F (37.6 C) 98.6 F (37 C) 98.5 F (36.9 C)  TempSrc: Oral Oral Oral Oral  SpO2:      Weight:      Height:       No intake or output data in the 24 hours ending 04/12/24 1835 Filed Weights   04/08/24 1636 04/08/24 2312  Weight: 69.4 kg 73.3 kg   Examination: Physical Exam:  Constitutional: WN/WD overweight elderly African-American female no acute distress Respiratory: Diminished to auscultation bilaterally, no wheezing, rales, rhonchi or crackles. Normal respiratory effort and patient is not tachypenic. No accessory muscle use.  Unlabored breathing Cardiovascular: RRR, no murmurs / rubs / gallops. S1 and S2 auscultated. No extremity edema.  Abdomen: Soft, minimally-tender, distended secondary to habitus. Bowel sounds positive.  GU: Deferred. Musculoskeletal: No clubbing / cyanosis of digits/nails. No joint deformity upper and lower extremities.  Skin: No rashes, lesions, ulcers on a limited skin evaluation. No induration; Warm and dry.  Neurologic: CN 2-12 grossly intact with no focal deficits. Romberg sign and cerebellar reflexes not assessed.  Psychiatric: Normal judgment and insight. Alert and oriented x 3. Normal mood and appropriate affect.   Data Reviewed: I have personally reviewed following labs and imaging studies  CBC: Recent Labs  Lab 04/08/24 1716 04/09/24 0622 04/12/24 1016  WBC 8.0 7.2 11.2*  NEUTROABS 5.7  --  6.3  HGB 13.4 12.0 12.1  HCT 41.6 37.1 36.5  MCV 83.7 84.3 81.7  PLT 281 246 272   Basic Metabolic Panel: Recent Labs  Lab 04/08/24 1716 04/09/24 0622 04/10/24 0450 04/11/24 0559 04/12/24 1016  NA 141 141 138 138 139  K 3.7 3.3* 3.1* 3.3* 3.1*  CL 103 109 108 106 108  CO2 21* 19* 19* 20* 17*  GLUCOSE 107* 112* 94 104* 106*  BUN 32* 30* 25* 18 22  CREATININE 2.00* 1.47* 1.36* 1.34* 1.48*  CALCIUM 9.8 8.7* 9.0 9.0 9.6  MG  --   --  1.8 1.8 1.8  PHOS  --   --  1.7* 2.0*  2.9   GFR: Estimated Creatinine Clearance: 30.5 mL/min (A) (by C-G formula based on SCr of 1.48 mg/dL (H)). Liver Function Tests: Recent Labs  Lab 04/08/24 1716 04/12/24 1016  AST 18 16  ALT 8 7  ALKPHOS 95 92  BILITOT 0.6 0.4  PROT 8.0 7.3  ALBUMIN 4.7 3.7   Recent Labs  Lab 04/08/24 1716  LIPASE 15   No results for input(s): AMMONIA in the last 168 hours. Coagulation Profile: No results for input(s): INR, PROTIME in the last 168 hours. Cardiac Enzymes: No results for input(s): CKTOTAL, CKMB, CKMBINDEX, TROPONINI in the last 168 hours. BNP (last 3 results) No results for input(s): PROBNP in the last 8760 hours. HbA1C: No results for input(s): HGBA1C in the last 72 hours. CBG: Recent Labs  Lab 04/09/24 0753  GLUCAP 97   Lipid Profile: No results for input(s): CHOL, HDL, LDLCALC, TRIG, CHOLHDL, LDLDIRECT in the last 72 hours. Thyroid Function Tests: No results for input(s): TSH, T4TOTAL, FREET4, T3FREE, THYROIDAB  in the last 72 hours. Anemia Panel: No results for input(s): VITAMINB12, FOLATE, FERRITIN, TIBC, IRON, RETICCTPCT in the last 72 hours. Sepsis Labs: No results for input(s): PROCALCITON, LATICACIDVEN in the last 168 hours.  Recent Results (from the past 240 hours)  Gastrointestinal Panel by PCR , Stool     Status: None   Collection Time: 04/08/24  5:16 PM   Specimen: Stool  Result Value Ref Range Status   Campylobacter species NOT DETECTED NOT DETECTED Final   Plesimonas shigelloides NOT DETECTED NOT DETECTED Final   Salmonella species NOT DETECTED NOT DETECTED Final   Yersinia enterocolitica NOT DETECTED NOT DETECTED Final   Vibrio species NOT DETECTED NOT DETECTED Final   Vibrio cholerae NOT DETECTED NOT DETECTED Final   Enteroaggregative E coli (EAEC) NOT DETECTED NOT DETECTED Final   Enteropathogenic E coli (EPEC) NOT DETECTED NOT DETECTED Final   Enterotoxigenic E coli (ETEC) NOT DETECTED  NOT DETECTED Final   Shiga like toxin producing E coli (STEC) NOT DETECTED NOT DETECTED Final   Shigella/Enteroinvasive E coli (EIEC) NOT DETECTED NOT DETECTED Final   Cryptosporidium NOT DETECTED NOT DETECTED Final   Cyclospora cayetanensis NOT DETECTED NOT DETECTED Final   Entamoeba histolytica NOT DETECTED NOT DETECTED Final   Giardia lamblia NOT DETECTED NOT DETECTED Final   Adenovirus F40/41 NOT DETECTED NOT DETECTED Final   Astrovirus NOT DETECTED NOT DETECTED Final   Norovirus GI/GII NOT DETECTED NOT DETECTED Final   Rotavirus A NOT DETECTED NOT DETECTED Final   Sapovirus (I, II, IV, and V) NOT DETECTED NOT DETECTED Final    Comment: Performed at [facility], [address] [place], [place] [address]  C Difficile Quick Screen w PCR reflex     Status: None   Collection Time: 04/08/24  5:16 PM   Specimen: Stool  Result Value Ref Range Status   C Diff antigen NEGATIVE NEGATIVE Final   C Diff toxin NEGATIVE NEGATIVE Final   C Diff interpretation No C. difficile detected.  Final    Comment: Performed at [facility], [address] [place], [place] [address]    Radiology Studies: No results found.  Scheduled Meds:  heparin   5,000 Units Subcutaneous Q8H   potassium chloride   40 mEq Oral BID   sodium bicarbonate   650 mg Oral TID   Continuous Infusions:  lactated ringers  100 mL/hr at 04/12/24 1834   magnesium  sulfate bolus IVPB      LOS: 1 day   Alejandro Marker, DO [facility] Available via Epic secure chat 7am-7pm After these hours, please refer to coverage provider listed on [email or web address] 04/12/2024, 6:35 PM  "

## 2024-04-13 ENCOUNTER — Inpatient Hospital Stay (HOSPITAL_COMMUNITY)

## 2024-04-13 DIAGNOSIS — R1110 Vomiting, unspecified: Secondary | ICD-10-CM | POA: Diagnosis not present

## 2024-04-13 DIAGNOSIS — I10 Essential (primary) hypertension: Secondary | ICD-10-CM | POA: Diagnosis not present

## 2024-04-13 DIAGNOSIS — N179 Acute kidney failure, unspecified: Secondary | ICD-10-CM | POA: Diagnosis not present

## 2024-04-13 DIAGNOSIS — R197 Diarrhea, unspecified: Secondary | ICD-10-CM | POA: Diagnosis not present

## 2024-04-13 LAB — COMPREHENSIVE METABOLIC PANEL WITH GFR
ALT: 6 U/L (ref 0–44)
AST: 12 U/L — ABNORMAL LOW (ref 15–41)
Albumin: 3.3 g/dL — ABNORMAL LOW (ref 3.5–5.0)
Alkaline Phosphatase: 86 U/L (ref 38–126)
Anion gap: 12 (ref 5–15)
BUN: 23 mg/dL (ref 8–23)
CO2: 20 mmol/L — ABNORMAL LOW (ref 22–32)
Calcium: 9.2 mg/dL (ref 8.9–10.3)
Chloride: 105 mmol/L (ref 98–111)
Creatinine, Ser: 1.71 mg/dL — ABNORMAL HIGH (ref 0.44–1.00)
GFR, Estimated: 30 mL/min — ABNORMAL LOW
Glucose, Bld: 110 mg/dL — ABNORMAL HIGH (ref 70–99)
Potassium: 3.5 mmol/L (ref 3.5–5.1)
Sodium: 137 mmol/L (ref 135–145)
Total Bilirubin: 0.3 mg/dL (ref 0.0–1.2)
Total Protein: 6.6 g/dL (ref 6.5–8.1)

## 2024-04-13 LAB — URINALYSIS, ROUTINE W REFLEX MICROSCOPIC
Bilirubin Urine: NEGATIVE
Glucose, UA: NEGATIVE mg/dL
Hgb urine dipstick: NEGATIVE
Ketones, ur: NEGATIVE mg/dL
Nitrite: NEGATIVE
Protein, ur: NEGATIVE mg/dL
Specific Gravity, Urine: 1.017 (ref 1.005–1.030)
pH: 5 (ref 5.0–8.0)

## 2024-04-13 LAB — CBC WITH DIFFERENTIAL/PLATELET
Abs Immature Granulocytes: 0.48 K/uL — ABNORMAL HIGH (ref 0.00–0.07)
Basophils Absolute: 0.1 K/uL (ref 0.0–0.1)
Basophils Relative: 1 %
Eosinophils Absolute: 0.1 K/uL (ref 0.0–0.5)
Eosinophils Relative: 1 %
HCT: 33.7 % — ABNORMAL LOW (ref 36.0–46.0)
Hemoglobin: 11.2 g/dL — ABNORMAL LOW (ref 12.0–15.0)
Immature Granulocytes: 5 %
Lymphocytes Relative: 30 %
Lymphs Abs: 2.7 K/uL (ref 0.7–4.0)
MCH: 27.1 pg (ref 26.0–34.0)
MCHC: 33.2 g/dL (ref 30.0–36.0)
MCV: 81.6 fL (ref 80.0–100.0)
Monocytes Absolute: 1 K/uL (ref 0.1–1.0)
Monocytes Relative: 11 %
Neutro Abs: 5 K/uL (ref 1.7–7.7)
Neutrophils Relative %: 52 %
Platelets: 247 K/uL (ref 150–400)
RBC: 4.13 MIL/uL (ref 3.87–5.11)
RDW: 14.6 % (ref 11.5–15.5)
WBC: 9.3 K/uL (ref 4.0–10.5)
nRBC: 0 % (ref 0.0–0.2)

## 2024-04-13 LAB — PHOSPHORUS: Phosphorus: 2.3 mg/dL — ABNORMAL LOW (ref 2.5–4.6)

## 2024-04-13 LAB — MAGNESIUM: Magnesium: 2.6 mg/dL — ABNORMAL HIGH (ref 1.7–2.4)

## 2024-04-13 MED ORDER — POTASSIUM CHLORIDE 20 MEQ PO PACK
40.0000 meq | PACK | Freq: Once | ORAL | Status: AC
  Administered 2024-04-13: 40 meq via ORAL
  Filled 2024-04-13: qty 2

## 2024-04-13 MED ORDER — POTASSIUM CHLORIDE 10 MEQ/100ML IV SOLN: 10.0000 meq | INTRAVENOUS | Status: DC

## 2024-04-13 MED ORDER — LACTATED RINGERS IV SOLN: INTRAVENOUS | Status: AC

## 2024-04-13 MED ORDER — K PHOS MONO-SOD PHOS DI & MONO 155-852-130 MG PO TABS
500.0000 mg | ORAL_TABLET | Freq: Two times a day (BID) | ORAL | Status: AC
  Administered 2024-04-13 (×2): 500 mg via ORAL
  Filled 2024-04-13 (×2): qty 2

## 2024-04-13 NOTE — Progress Notes (Signed)
 " PROGRESS NOTE    Victoria Lyons  FMW:[id number] DOB: [birth year] DOA: 04/08/2024 PCP: Douglass Gerard DEL, PA-C   Brief Narrative:  The patient is an 83 year old with history of hypertension, glaucoma and vertigo presented with about 6 days of nausea, poor appetite, ongoing watery diarrhea. Felt dehydrated. In the emergency room blood pressure stable. Afebrile. On room air. BUN 32, creatinine 2 with recently normal creatinine. Admitted due to AKI, significant dehydration.  Her nausea vomiting is improving however continues to have some persistent diarrhea.  Likely this was in the setting of infective viral gastroenteritis.  Assessment and Plan:  Persistent Diarrhea and Nausea/ Vomiting: Likely Viral infective gastroenteritis.   -CT Abd/Pelvis done and showed Diffuse fluid-filled nondilated small bowel and colon, which was suggestive of enteritis or ileus. Additionally there was No evidence for bowel obstruction or bowel wall thickening but  there was Upper normal size of appendix that is diffusely fluid-filled, but no definitive periappendiceal stranding to suggest active inflammation. Notably there was also Aortic atherosclerosis. -Significantly dehydrated. Nausea and Vomiting improved but still having quite a bit of diarrhea -Encourage oral intake and liquid intake -Resume and Continue maintenance IV fluids today as she is high risk of dehydration and decompensation. -Intake output monitoring -C. difficile negative.  GI pathogen panel negative.  -WBC Trend:  Recent Labs  Lab 04/08/24 1716 04/09/24 0622 04/12/24 1016 04/13/24 0603  WBC 8.0 7.2 11.2* 9.3  -Supportive Care and C/w IVF and Loperamide  2 mg po q4hprn Diarrhea or Loose Stools.  -Antiemetics w/ po/IV Ondansetron  q6hprn Nausea and IV Prochlorperazine  5 mg q6hprn Refractory N/V   Hypokalemia: K+ Trend went from 3.3 -> 3.1 -> 3.3 -> 3.1 -> 3.5. Replete w/ po KCL 40 mEQ x1 and po K Phos  Neutral 500 mg po BID. CTM and Replete as  Necessary. Repeat CMP in the AM   Hypophosphatemia: Improved. Phos Level went from 1.7 -> 2.0 -> 2.9 -> 2.3. Replete w/ po K Phos  Neutral 500 mg po BID x2. CTM and Trend and repeat Phos Level in the AM  Leukocytosis: Likely 2/2 to above. WBC went from 7.2 -> 11.2. CTM and Trend and repeat CBC in the AM    AKI on CKD stage 3b: Baseline Cr used to be to be ~0.9-1.1 however this was earlier this year. May have had progression in Renal Disease. Due to above. BUN/Cr Trend: Recent Labs  Lab 04/08/24 1716 04/09/24 0622 04/10/24 0450 04/11/24 0559 04/12/24 1016 04/13/24 0603  BUN 32* 30* 25* 18 22 23   CREATININE 2.00* 1.47* 1.36* 1.34* 1.48* 1.71*  -IVF restarted w/ LR @ 100 mL/hr x 1 day and now will change to 75 mL/hr x1 Day -Check Renal U/S and UA given slight worsening in Renal Fxn. U/A only showed hazy appearance with large leukocytes, negative nitrites, rare bacteria, 0-5 squamous epithelial cells, 0-5 RBCs per high-power field, and 11-20 WBCs -Renal ultrasound done showed Bilateral echogenic kidneys which likely represents sequelae associated with medical renal disease as well as a Small simple right renal cyst. -Avoid Nephrotoxic Medications, Contrast Dyes, Hypotension and Dehydration to Ensure Adequate Renal Perfusion and will need to Renally Adjust Meds. CTM & Trend Renal Function carefully & repeat CMP in the AM   Normocytic Anemia: Hgb/Hct Trend:  Recent Labs  Lab 04/08/24 1716 04/09/24 0622 04/12/24 1016 04/13/24 0603  HGB 13.4 12.0 12.1 11.2*  HCT 41.6 37.1 36.5 33.7*  MCV 83.7 84.3 81.7 81.6  -Likely dilutional Drop from IVF. CTM for S/Sx of  Bleeding; No overt bleeding noted. Repeat CBC in the AM  Metabolic Acidosis: CO2 is now 20, AG is 12, and Chloride Level is 105. IVF as above. C/w Sodium Bicarbonate  650 mg po TID. CTM and Trend and repeat CMP in the AM.    Essential Hypertension: Blood pressure stable.  Holding antihypertensives to avoid hypotension due to her  continued Diarrhea. CTM BP per Protocol. Last BP reading was 125/62  Hypoalbuminemia: Patient's Albumin Lvl went from 4.7 -> 3.7 -> 3.3. CTM & Trend & repeat CMP in the AM    DVT prophylaxis: heparin  injection 5,000 Units Start: 04/09/24 0600    Code Status: Full Code Family Communication: No family present @ bedside  Disposition Plan:  Level of care: Med-Surg Status is: Inpatient Remains inpatient appropriate because: Needs little bit further improvement in her labs and diarrhea and anticipate discharge in next 24 hours if cleared by PT OT evaluations which are pending  Consultants:  None  Procedures:  As delineated above  Antimicrobials:  Anti-infectives (From admission, onward)    None       Subjective: Seen and examined at bedside and think she is doing little bit better.  Still having some diarrhea but staying is not as severe today.  No nausea or vomiting.  Denies any lightheadedness or dizziness.  No other concerns or complaints at this time.  Objective: Vitals:   04/12/24 1444 04/12/24 1900 04/13/24 0502 04/13/24 1446  BP: 139/69 102/63 138/70 125/62  Pulse: (!) 105 89 95 80  Resp: 16 18 18 18   Temp: 98.5 F (36.9 C) 98.1 F (36.7 C) 98.3 F (36.8 C) 98.3 F (36.8 C)  TempSrc: Oral Oral Oral   SpO2:  98% 94% 97%  Weight:      Height:        Intake/Output Summary (Last 24 hours) at 04/13/2024 1803 Last data filed at 04/13/2024 1600 Gross per 24 hour  Intake 1910 ml  Output --  Net 1910 ml   Filed Weights   04/08/24 1636 04/08/24 2312  Weight: 69.4 kg 73.3 kg   Examination: Physical Exam:  Constitutional: WN/WD, overweight elderly African-American female in no acute distress Respiratory: Diminished to auscultation bilaterally, no wheezing, rales, rhonchi or crackles. Normal respiratory effort and patient is not tachypenic. No accessory muscle use.  Unlabored breathing Cardiovascular: RRR, no murmurs / rubs / gallops. S1 and S2 auscultated. No  extremity edema Abdomen: Soft, non-tender, slightly distended 2/2 body habitus. Bowel sounds positive.  GU: Deferred. Musculoskeletal: No clubbing / cyanosis of digits/nails. No joint deformity upper and lower extremities. Skin: No rashes, lesions, ulcers on a limited skin evaluation. No induration; Warm and dry.  Neurologic: CN 2-12 grossly intact with no focal deficits. Romberg sign and cerebellar reflexes not assessed.  Psychiatric: Normal judgment and insight. Alert and oriented x 3. Normal mood and appropriate affect.   Data Reviewed: I have personally reviewed following labs and imaging studies  CBC: Recent Labs  Lab 04/08/24 1716 04/09/24 0622 04/12/24 1016 04/13/24 0603  WBC 8.0 7.2 11.2* 9.3  NEUTROABS 5.7  --  6.3 5.0  HGB 13.4 12.0 12.1 11.2*  HCT 41.6 37.1 36.5 33.7*  MCV 83.7 84.3 81.7 81.6  PLT 281 246 272 247   Basic Metabolic Panel: Recent Labs  Lab 04/09/24 0622 04/10/24 0450 04/11/24 0559 04/12/24 1016 04/13/24 0603  NA 141 138 138 139 137  K 3.3* 3.1* 3.3* 3.1* 3.5  CL 109 108 106 108 105  CO2 19* 19* 20*  17* 20*  GLUCOSE 112* 94 104* 106* 110*  BUN 30* 25* 18 22 23   CREATININE 1.47* 1.36* 1.34* 1.48* 1.71*  CALCIUM 8.7* 9.0 9.0 9.6 9.2  MG  --  1.8 1.8 1.8 2.6*  PHOS  --  1.7* 2.0* 2.9 2.3*   GFR: Estimated Creatinine Clearance: 26.4 mL/min (A) (by C-G formula based on SCr of 1.71 mg/dL (H)). Liver Function Tests: Recent Labs  Lab 04/08/24 1716 04/12/24 1016 04/13/24 0603  AST 18 16 12*  ALT 8 7 6   ALKPHOS 95 92 86  BILITOT 0.6 0.4 0.3  PROT 8.0 7.3 6.6  ALBUMIN 4.7 3.7 3.3*   Recent Labs  Lab 04/08/24 1716  LIPASE 15   No results for input(s): AMMONIA in the last 168 hours. Coagulation Profile: No results for input(s): INR, PROTIME in the last 168 hours. Cardiac Enzymes: No results for input(s): CKTOTAL, CKMB, CKMBINDEX, TROPONINI in the last 168 hours. BNP (last 3 results) No results for input(s): PROBNP in the  last 8760 hours. HbA1C: No results for input(s): HGBA1C in the last 72 hours. CBG: Recent Labs  Lab 04/09/24 0753  GLUCAP 97   Lipid Profile: No results for input(s): CHOL, HDL, LDLCALC, TRIG, CHOLHDL, LDLDIRECT in the last 72 hours. Thyroid Function Tests: No results for input(s): TSH, T4TOTAL, FREET4, T3FREE, THYROIDAB in the last 72 hours. Anemia Panel: No results for input(s): VITAMINB12, FOLATE, FERRITIN, TIBC, IRON, RETICCTPCT in the last 72 hours. Sepsis Labs: No results for input(s): PROCALCITON, LATICACIDVEN in the last 168 hours.  Recent Results (from the past 240 hours)  Gastrointestinal Panel by PCR , Stool     Status: None   Collection Time: 04/08/24  5:16 PM   Specimen: Stool  Result Value Ref Range Status   Campylobacter species NOT DETECTED NOT DETECTED Final   Plesimonas shigelloides NOT DETECTED NOT DETECTED Final   Salmonella species NOT DETECTED NOT DETECTED Final   Yersinia enterocolitica NOT DETECTED NOT DETECTED Final   Vibrio species NOT DETECTED NOT DETECTED Final   Vibrio cholerae NOT DETECTED NOT DETECTED Final   Enteroaggregative E coli (EAEC) NOT DETECTED NOT DETECTED Final   Enteropathogenic E coli (EPEC) NOT DETECTED NOT DETECTED Final   Enterotoxigenic E coli (ETEC) NOT DETECTED NOT DETECTED Final   Shiga like toxin producing E coli (STEC) NOT DETECTED NOT DETECTED Final   Shigella/Enteroinvasive E coli (EIEC) NOT DETECTED NOT DETECTED Final   Cryptosporidium NOT DETECTED NOT DETECTED Final   Cyclospora cayetanensis NOT DETECTED NOT DETECTED Final   Entamoeba histolytica NOT DETECTED NOT DETECTED Final   Giardia lamblia NOT DETECTED NOT DETECTED Final   Adenovirus F40/41 NOT DETECTED NOT DETECTED Final   Astrovirus NOT DETECTED NOT DETECTED Final   Norovirus GI/GII NOT DETECTED NOT DETECTED Final   Rotavirus A NOT DETECTED NOT DETECTED Final   Sapovirus (I, II, IV, and V) NOT DETECTED NOT DETECTED Final     Comment: Performed at [facility], [address] [place], [place] [address]  C Difficile Quick Screen w PCR reflex     Status: None   Collection Time: 04/08/24  5:16 PM   Specimen: Stool  Result Value Ref Range Status   C Diff antigen NEGATIVE NEGATIVE Final   C Diff toxin NEGATIVE NEGATIVE Final   C Diff interpretation No C. difficile detected.  Final    Comment: Performed at [facility], [address] [place], [place] [address]    Radiology Studies: US  RENAL Result Date: 04/13/2024  CLINICAL DATA:  Acute kidney injury. EXAM: RENAL / URINARY TRACT ULTRASOUND COMPLETE COMPARISON:  None Available. FINDINGS: Right Kidney: Renal measurements: 10.5 cm x 3.9 cm x 4.7 cm = volume: A 9.3 mL. There is diffusely increased echogenicity of the renal parenchyma. A 1.6 cm x 1.2 cm x 1.2 cm simple cyst is seen within the right kidney. No hydronephrosis is visualized. Left Kidney: Renal measurements: 10.9 cm x 4.4 cm x 4.3 cm = volume: 106.7 mL. There is diffusely increased echogenicity of the renal parenchyma. No mass or hydronephrosis visualized. Bladder: Appears normal for degree of bladder distention. The ureteral jets are not visualized. Other: None. IMPRESSION: 1. Bilateral echogenic kidneys which likely represents sequelae associated with medical renal disease. 2. Small simple right renal cyst. Electronically Signed   By: Suzen Dials M.D.   On: 04/13/2024 14:15   Scheduled Meds:  heparin   5,000 Units Subcutaneous Q8H   phosphorus  500 mg Oral BID   sodium bicarbonate   650 mg Oral TID   Continuous Infusions:  lactated ringers  75 mL/hr at 04/13/24 1754    LOS: 2 days   Alejandro Marker, DO [facility] Available via Epic secure chat 7am-7pm After these hours, please refer to coverage provider listed on [email or web address] 04/13/2024, 6:03 PM  "

## 2024-04-13 NOTE — Plan of Care (Signed)
" °  Problem: Education: Goal: Knowledge of General Education information will improve Description: Including pain rating scale, medication(s)/side effects and non-pharmacologic comfort measures Outcome: Progressing   Problem: Clinical Measurements: Goal: Ability to maintain clinical measurements within normal limits will improve Outcome: Progressing Goal: Diagnostic test results will improve Outcome: Progressing   Problem: Nutrition: Goal: Adequate nutrition will be maintained Outcome: Progressing   Problem: Elimination: Goal: Will not experience complications related to bowel motility Outcome: Progressing Goal: Will not experience complications related to urinary retention Outcome: Progressing   Problem: Pain Managment: Goal: General experience of comfort will improve and/or be controlled Outcome: Progressing   Problem: Safety: Goal: Ability to remain free from injury will improve Outcome: Progressing   "

## 2024-04-14 ENCOUNTER — Other Ambulatory Visit (HOSPITAL_COMMUNITY): Payer: Self-pay

## 2024-04-14 LAB — COMPREHENSIVE METABOLIC PANEL WITH GFR
ALT: 6 U/L (ref 0–44)
AST: 12 U/L — ABNORMAL LOW (ref 15–41)
Albumin: 3.1 g/dL — ABNORMAL LOW (ref 3.5–5.0)
Alkaline Phosphatase: 106 U/L (ref 38–126)
Anion gap: 12 (ref 5–15)
BUN: 19 mg/dL (ref 8–23)
CO2: 20 mmol/L — ABNORMAL LOW (ref 22–32)
Calcium: 8.8 mg/dL — ABNORMAL LOW (ref 8.9–10.3)
Chloride: 105 mmol/L (ref 98–111)
Creatinine, Ser: 1.22 mg/dL — ABNORMAL HIGH (ref 0.44–1.00)
GFR, Estimated: 44 mL/min — ABNORMAL LOW
Glucose, Bld: 104 mg/dL — ABNORMAL HIGH (ref 70–99)
Potassium: 3.4 mmol/L — ABNORMAL LOW (ref 3.5–5.1)
Sodium: 137 mmol/L (ref 135–145)
Total Bilirubin: 0.3 mg/dL (ref 0.0–1.2)
Total Protein: 6.2 g/dL — ABNORMAL LOW (ref 6.5–8.1)

## 2024-04-14 LAB — MAGNESIUM: Magnesium: 2.2 mg/dL (ref 1.7–2.4)

## 2024-04-14 LAB — CBC WITH DIFFERENTIAL/PLATELET
Abs Immature Granulocytes: 0.48 K/uL — ABNORMAL HIGH (ref 0.00–0.07)
Basophils Absolute: 0.1 K/uL (ref 0.0–0.1)
Basophils Relative: 1 %
Eosinophils Absolute: 0.1 K/uL (ref 0.0–0.5)
Eosinophils Relative: 1 %
HCT: 32.7 % — ABNORMAL LOW (ref 36.0–46.0)
Hemoglobin: 10.9 g/dL — ABNORMAL LOW (ref 12.0–15.0)
Immature Granulocytes: 4 %
Lymphocytes Relative: 25 %
Lymphs Abs: 2.7 K/uL (ref 0.7–4.0)
MCH: 27.2 pg (ref 26.0–34.0)
MCHC: 33.3 g/dL (ref 30.0–36.0)
MCV: 81.5 fL (ref 80.0–100.0)
Monocytes Absolute: 1.3 K/uL — ABNORMAL HIGH (ref 0.1–1.0)
Monocytes Relative: 12 %
Neutro Abs: 6.3 K/uL (ref 1.7–7.7)
Neutrophils Relative %: 57 %
Platelets: 237 K/uL (ref 150–400)
RBC: 4.01 MIL/uL (ref 3.87–5.11)
RDW: 14.6 % (ref 11.5–15.5)
Smear Review: NORMAL
WBC: 10.9 K/uL — ABNORMAL HIGH (ref 4.0–10.5)
nRBC: 0 % (ref 0.0–0.2)

## 2024-04-14 LAB — PHOSPHORUS: Phosphorus: 2.8 mg/dL (ref 2.5–4.6)

## 2024-04-14 MED ORDER — SIMETHICONE 80 MG PO CHEW
80.0000 mg | CHEWABLE_TABLET | Freq: Four times a day (QID) | ORAL | 0 refills | Status: AC | PRN
  Filled 2024-04-14: qty 30, 8d supply, fill #0

## 2024-04-14 MED ORDER — SIMETHICONE 80 MG PO CHEW: 80.0000 mg | CHEWABLE_TABLET | Freq: Four times a day (QID) | ORAL | Status: DC | PRN

## 2024-04-14 MED ORDER — ACETAMINOPHEN 325 MG PO TABS
650.0000 mg | ORAL_TABLET | Freq: Four times a day (QID) | ORAL | 0 refills | Status: AC | PRN
  Filled 2024-04-14: qty 20, 3d supply, fill #0

## 2024-04-14 MED ORDER — ONDANSETRON HCL 4 MG PO TABS
4.0000 mg | ORAL_TABLET | Freq: Four times a day (QID) | ORAL | 0 refills | Status: AC | PRN
  Filled 2024-04-14: qty 20, 5d supply, fill #0

## 2024-04-14 MED ORDER — OXYCODONE HCL 5 MG PO TABS
2.5000 mg | ORAL_TABLET | ORAL | 0 refills | Status: AC | PRN
  Filled 2024-04-14: qty 10, 2d supply, fill #0

## 2024-04-14 MED ORDER — SODIUM BICARBONATE 650 MG PO TABS
650.0000 mg | ORAL_TABLET | Freq: Three times a day (TID) | ORAL | 0 refills | Status: AC
  Filled 2024-04-14: qty 9, 3d supply, fill #0

## 2024-04-14 MED ORDER — POTASSIUM CHLORIDE CRYS ER 20 MEQ PO TBCR
40.0000 meq | EXTENDED_RELEASE_TABLET | Freq: Two times a day (BID) | ORAL | Status: DC
  Administered 2024-04-14: 40 meq via ORAL
  Filled 2024-04-14: qty 2

## 2024-04-14 MED ORDER — LOPERAMIDE HCL 2 MG PO CAPS
2.0000 mg | ORAL_CAPSULE | ORAL | 0 refills | Status: AC | PRN
  Filled 2024-04-14: qty 30, 5d supply, fill #0

## 2024-04-14 NOTE — Discharge Summary (Signed)
 " Physician Discharge Summary   Patient: Victoria Lyons MRN: [id number] DOB: 08/12/1940  Admit date:     04/08/2024  Discharge date: 04/14/2024  Discharge Physician: Alejandro Marker, DO   PCP: Douglass Gerard VEAR, PA-C   Recommendations at discharge:   Follow-up with PCP within 1 to 2 weeks repeat CBC, CMP, mag, Phos within 1 week  Discharge Diagnoses: Principal Problem:   Acute renal failure superimposed on stage 3a chronic kidney disease (HCC) Active Problems:   Vomiting and diarrhea   Hypertension   AKI (acute kidney injury)  Resolved Problems:   * No resolved hospital problems. [facility] Course: The patient is an 83 year old with history of hypertension, glaucoma and vertigo presented with about 6 days of nausea, poor appetite, ongoing watery diarrhea. Felt dehydrated. In the emergency room blood pressure stable. Afebrile. On room air. BUN 32, creatinine 2 with recently normal creatinine. Admitted due to AKI, significant dehydration.  Her nausea vomiting is improving however continues to have some persistent diarrhea.  Likely this was in the setting of infective viral gastroenteritis.  She is improved significantly and felt well and medically stable for discharge and will need to follow-up with PCP within 1 week  Assessment and Plan:  Persistent Diarrhea and Nausea/ Vomiting: Likely Viral infective gastroenteritis.   -CT Abd/Pelvis done and showed Diffuse fluid-filled nondilated small bowel and colon, which was suggestive of enteritis or ileus. Additionally there was No evidence for bowel obstruction or bowel wall thickening but  there was Upper normal size of appendix that is diffusely fluid-filled, but no definitive periappendiceal stranding to suggest active inflammation. Notably there was also Aortic atherosclerosis. -Significantly dehydrated. Nausea and Vomiting improved but still having quite a bit of diarrhea -Encourage oral intake and liquid intake -Resumed and Continue  maintenance IV fluids today as she is high risk of dehydration and decompensation. -Intake output monitoring and she is doing quite well now -C. difficile negative.  GI pathogen panel negative.  -WBC Trend:  Recent Labs  Lab 04/08/24 1716 04/09/24 0622 04/12/24 1016 04/13/24 0603 04/14/24 0609  WBC 8.0 7.2 11.2* 9.3 10.9*  -Supportive Care and C/w IVF and Loperamide  2 mg po q4hprn Diarrhea or Loose Stools.  -Antiemetics w/ po/IV Ondansetron  q6hprn Nausea and IV Prochlorperazine  5 mg q6hprn Refractory N/V -He is improved and medically stable for discharge at this time as PT/OT recommended no follow-up -Follow-up with gastroenterology Dr. Ladora in outpatient setting within 1 to 2 weeks   Hypokalemia: K+ Trend went from 3.3 -> 3.1 -> 3.3 -> 3.1 -> 3.5 and is now 3.4 at the time of discharge. Replete prior to discharge. CTM and Replete as Necessary. Repeat CMP in the AM   Hypophosphatemia: Improved. Phos Level went from 1.7 -> 2.0 -> 2.9 -> 2.3 -> 2.8. Replete w/ po K Phos  Neutral 500 mg po BID x2. CTM and Trend and repeat Phos Level in the AM  Leukocytosis: Likely 2/2 to above. WBC went from 7.2 -> 11.2 -> 9.3 -> 10.9. CTM and Trend and repeat CBC in the AM    AKI on CKD stage 3b: Improved. Baseline Cr used to be to be ~0.9-1.1 however this was earlier this year. May have had progression in Renal Disease. Due to above. BUN/Cr Trend: Recent Labs  Lab 04/08/24 1716 04/09/24 0622 04/10/24 0450 04/11/24 0559 04/12/24 1016 04/13/24 0603 04/14/24 0609  BUN 32* 30* 25* 18 22 23 19   CREATININE 2.00* 1.47* 1.36* 1.34* 1.48* 1.71* 1.22*  -IVF restarted w/  LR @ 100 mL/hr x 1 day and now will change to 75 mL/hr x1 Day -Check Renal U/S and UA given slight worsening in Renal Fxn. U/A only showed hazy appearance with large leukocytes, negative nitrites, rare bacteria, 0-5 squamous epithelial cells, 0-5 RBCs per high-power field, and 11-20 WBCs -Renal ultrasound done showed Bilateral echogenic kidneys  which likely represents sequelae associated with medical renal disease as well as a Small simple right renal cyst. -Avoid Nephrotoxic Medications, Contrast Dyes, Hypotension and Dehydration to Ensure Adequate Renal Perfusion and will need to Renally Adjust Meds. CTM & Trend Renal Function carefully & repeat CMP in the AM   Normocytic Anemia: Hgb/Hct Trend:  Recent Labs  Lab 04/08/24 1716 04/09/24 0622 04/12/24 1016 04/13/24 0603 04/14/24 0609  HGB 13.4 12.0 12.1 11.2* 10.9*  HCT 41.6 37.1 36.5 33.7* 32.7*  MCV 83.7 84.3 81.7 81.6 81.5  -Likely dilutional Drop from IVF. CTM for S/Sx of Bleeding; No overt bleeding noted. Repeat CBC in the AM  Metabolic Acidosis: CO2 is now 20, AG is 12, and Chloride Level is 105. IVF as above. C/w Sodium Bicarbonate  650 mg po TID x 3 Days. CTM and Trend and repeat CMP in the AM.    Essential Hypertension: Blood pressure stable.  Holding antihypertensives to avoid hypotension due to her continued Diarrhea. CTM BP per Protocol. Last BP reading was 125/62  Hypoalbuminemia: Patient's Albumin Lvl went from 4.7 -> 3.7 -> 3.3 and is 3.1 at the time of discharge. CTM & Trend & repeat CMP in the AM   Consultants: None Procedures performed: As delineated as above Disposition: Home Diet recommendation:  Discharge Diet Orders (From admission, onward)     Start     Ordered   04/14/24 0000  Diet general        04/14/24 1540           Cardiac diet DISCHARGE MEDICATION: Allergies as of 04/14/2024       Reactions   Augmentin [amoxicillin-pot Clavulanate] Palpitations   B/p increase    Ciprofloxacin Rash   Joint pain also   Demeclocycline Other (See Comments)   Unsure of exact reaction type   Latex Rash   Tetanus Toxoid-containing Vaccines Rash   Tetracyclines & Related Rash        Medication List     PAUSE taking these medications    amLODipine  5 MG tablet Wait to take this until your doctor or other care provider tells you to start  again. Commonly known as: NORVASC  Take 5 mg by mouth daily.       TAKE these medications    acetaminophen  325 MG tablet Commonly known as: TYLENOL  Take 2 tablets (650 mg total) by mouth every 6 (six) hours as needed for mild pain (pain score 1-3) or fever (or Fever >/= 101).   carboxymethylcellulose 1 % ophthalmic solution Place 1 drop into the left eye 3 (three) times daily as needed (Dry left eye).   fluticasone  50 MCG/ACT nasal spray Commonly known as: FLONASE  Place 1 spray into both nostrils daily as needed for allergies.   loperamide  2 MG capsule Commonly known as: IMODIUM  Take 1 capsule (2 mg total) by mouth every 4 (four) hours as needed for diarrhea or loose stools.   ondansetron  4 MG tablet Commonly known as: ZOFRAN  Take 1 tablet (4 mg total) by mouth every 6 (six) hours as needed for nausea.   oxyCODONE  5 MG immediate release tablet Commonly known as: Oxy IR/ROXICODONE  Take 0.5-1 tablets (2.5-5  mg total) by mouth every 4 (four) hours as needed for moderate pain (pain score 4-6) or severe pain (pain score 7-10).   simethicone  80 MG chewable tablet Commonly known as: MYLICON Chew 1 tablet (80 mg total) by mouth every 6 (six) hours as needed for flatulence.   sodium bicarbonate  650 MG tablet Take 1 tablet (650 mg total) by mouth 3 (three) times daily for 3 days.        Follow-up Information     Douglass Sayres H, PA-C. Call.   Specialty: Physician Assistant Why: Follow up within 1-2 weeks Contact information: [address] [place] [address] [phone]         Ladora Gunnar DEL., MD Follow up.   Specialty: Gastroenterology Contact information: [address] [place] [address] [phone]                Discharge Exam: Fredricka Weights   04/08/24 1636 04/08/24 2312  Weight: 69.4 kg 73.3 kg   Vitals:   04/14/24 0450 04/14/24 1150  BP: 119/69 (!) 102/49  Pulse: 85 83  Resp: 15 17  Temp: 98.3 F (36.8 C) 97.6  F (36.4 C)  SpO2: 97% 99%   Examination: Physical Exam:  Constitutional: Elderly female in no acute distress Respiratory: Diminished to auscultation bilaterally, no wheezing, rales, rhonchi or crackles. Normal respiratory effort and patient is not tachypenic. No accessory muscle use.  Unlabored breathing Cardiovascular: RRR, no murmurs / rubs / gallops. S1 and S2 auscultated. No extremity edema.  Abdomen: Soft, non-tender, non-distended. Bowel sounds positive.  GU: Deferred. Musculoskeletal: No clubbing / cyanosis of digits/nails. No joint deformity upper and lower extremities. Skin: No rashes, lesions, ulcers limited skin evaluation. No induration; Warm and dry.  Neurologic: CN 2-12 grossly intact with no focal deficits. Romberg sign and cerebellar reflexes not assessed.  Psychiatric: Normal judgment and insight. Alert and oriented x 3. Normal mood and appropriate affect.   Condition at discharge: stable  The results of significant diagnostics from this hospitalization (including imaging, microbiology, ancillary and laboratory) are listed below for reference.   Imaging Studies: US  RENAL Result Date: 04/13/2024 CLINICAL DATA:  Acute kidney injury. EXAM: RENAL / URINARY TRACT ULTRASOUND COMPLETE COMPARISON:  None Available. FINDINGS: Right Kidney: Renal measurements: 10.5 cm x 3.9 cm x 4.7 cm = volume: A 9.3 mL. There is diffusely increased echogenicity of the renal parenchyma. A 1.6 cm x 1.2 cm x 1.2 cm simple cyst is seen within the right kidney. No hydronephrosis is visualized. Left Kidney: Renal measurements: 10.9 cm x 4.4 cm x 4.3 cm = volume: 106.7 mL. There is diffusely increased echogenicity of the renal parenchyma. No mass or hydronephrosis visualized. Bladder: Appears normal for degree of bladder distention. The ureteral jets are not visualized. Other: None. IMPRESSION: 1. Bilateral echogenic kidneys which likely represents sequelae associated with medical renal disease. 2. Small  simple right renal cyst. Electronically Signed   By: Suzen Dials M.D.   On: 04/13/2024 14:15   CT ABDOMEN PELVIS WO CONTRAST Result Date: 04/08/2024 CLINICAL DATA:  Diarrhea EXAM: CT ABDOMEN AND PELVIS WITHOUT CONTRAST TECHNIQUE: Multidetector CT imaging of the abdomen and pelvis was performed following the standard protocol without IV contrast. RADIATION DOSE REDUCTION: This exam was performed according to the departmental dose-optimization program which includes automated exposure control, adjustment of the mA and/or kV according to patient size and/or use of iterative reconstruction technique. COMPARISON:  CT 07/23/2020 FINDINGS: Lower chest: Lung bases demonstrate no  acute airspace disease. Bandlike atelectasis or scarring at the lingula, right middle lobe and lung bases. Hepatobiliary: No focal liver abnormality is seen. No gallstones, gallbladder wall thickening, or biliary dilatation. Pancreas: Unremarkable. No pancreatic ductal dilatation or surrounding inflammatory changes. Spleen: Normal in size without focal abnormality. Adrenals/Urinary Tract: Adrenal glands are unremarkable. Kidneys are normal, without renal calculi, focal lesion, or hydronephrosis. Bladder is unremarkable. Stomach/Bowel: Stomach within normal limits. Diffuse fluid-filled nondilated small bowel. Diffuse fluid within the colon. No acute bowel wall thickening. Upper normal size of appendix measuring up to 7 mm which is also fluid-filled but no convincing Peri appendiceal stranding. Vascular/Lymphatic: Aortic atherosclerosis. No enlarged abdominal or pelvic lymph nodes. Reproductive: Status post hysterectomy. No adnexal masses. Other: No ascites or free air Musculoskeletal: Artifact from right hip arthroplasty. No acute or suspicious osseous abnormality IMPRESSION: 1. Diffuse fluid-filled nondilated small bowel and colon, findings are suggestive of enteritis or ileus. No evidence for bowel obstruction or bowel wall thickening. 2.  Upper normal size of appendix that is diffusely fluid-filled, but no definitive periappendiceal stranding to suggest active inflammation 3. Aortic atherosclerosis. Aortic Atherosclerosis (ICD10-I70.0). Electronically Signed   By: Luke Bun M.D.   On: 04/08/2024 19:10   Microbiology: Results for orders placed or performed during the hospital encounter of 04/08/24  Gastrointestinal Panel by PCR , Stool     Status: None   Collection Time: 04/08/24  5:16 PM   Specimen: Stool  Result Value Ref Range Status   Campylobacter species NOT DETECTED NOT DETECTED Final   Plesimonas shigelloides NOT DETECTED NOT DETECTED Final   Salmonella species NOT DETECTED NOT DETECTED Final   Yersinia enterocolitica NOT DETECTED NOT DETECTED Final   Vibrio species NOT DETECTED NOT DETECTED Final   Vibrio cholerae NOT DETECTED NOT DETECTED Final   Enteroaggregative E coli (EAEC) NOT DETECTED NOT DETECTED Final   Enteropathogenic E coli (EPEC) NOT DETECTED NOT DETECTED Final   Enterotoxigenic E coli (ETEC) NOT DETECTED NOT DETECTED Final   Shiga like toxin producing E coli (STEC) NOT DETECTED NOT DETECTED Final   Shigella/Enteroinvasive E coli (EIEC) NOT DETECTED NOT DETECTED Final   Cryptosporidium NOT DETECTED NOT DETECTED Final   Cyclospora cayetanensis NOT DETECTED NOT DETECTED Final   Entamoeba histolytica NOT DETECTED NOT DETECTED Final   Giardia lamblia NOT DETECTED NOT DETECTED Final   Adenovirus F40/41 NOT DETECTED NOT DETECTED Final   Astrovirus NOT DETECTED NOT DETECTED Final   Norovirus GI/GII NOT DETECTED NOT DETECTED Final   Rotavirus A NOT DETECTED NOT DETECTED Final   Sapovirus (I, II, IV, and V) NOT DETECTED NOT DETECTED Final    Comment: Performed at [facility], [address] [place], [place] [address]  C Difficile Quick Screen w PCR reflex     Status: None   Collection Time: 04/08/24  5:16 PM   Specimen: Stool  Result Value Ref Range Status   C Diff antigen NEGATIVE NEGATIVE  Final   C Diff toxin NEGATIVE NEGATIVE Final   C Diff interpretation No C. difficile detected.  Final    Comment: Performed at [facility], [address], [place], [place] [address]   Labs: CBC: Recent Labs  Lab 04/12/24 1016 04/13/24 0603 04/14/24 0609  WBC 11.2* 9.3 10.9*  NEUTROABS 6.3 5.0 6.3  HGB 12.1 11.2* 10.9*  HCT 36.5 33.7* 32.7*  MCV 81.7 81.6 81.5  PLT 272 247 237   Basic Metabolic Panel: Recent Labs  Lab 04/11/24 0559 04/12/24 1016 04/13/24 0603  04/14/24 0609  NA 138 139 137 137  K 3.3* 3.1* 3.5 3.4*  CL 106 108 105 105  CO2 20* 17* 20* 20*  GLUCOSE 104* 106* 110* 104*  BUN 18 22 23 19   CREATININE 1.34* 1.48* 1.71* 1.22*  CALCIUM 9.0 9.6 9.2 8.8*  MG 1.8 1.8 2.6* 2.2  PHOS 2.0* 2.9 2.3* 2.8   Liver Function Tests: Recent Labs  Lab 04/12/24 1016 04/13/24 0603 04/14/24 0609  AST 16 12* 12*  ALT 7 6 6   ALKPHOS 92 86 106  BILITOT 0.4 0.3 0.3  PROT 7.3 6.6 6.2*  ALBUMIN 3.7 3.3* 3.1*   CBG: No results for input(s): GLUCAP in the last 168 hours.  Discharge time spent: greater than 30 minutes.  Signed: Alejandro Marker, DO [facility] 04/17/2024 "

## 2024-04-14 NOTE — Plan of Care (Signed)
" °  Problem: Education: Goal: Knowledge of General Education information will improve Description: Including pain rating scale, medication(s)/side effects and non-pharmacologic comfort measures Outcome: Adequate for Discharge   Problem: Health Behavior/Discharge Planning: Goal: Ability to manage health-related needs will improve Outcome: Adequate for Discharge   Problem: Clinical Measurements: Goal: Ability to maintain clinical measurements within normal limits will improve 04/14/2024 1738 by Ezzard Loreli CROME, RN Outcome: Adequate for Discharge 04/14/2024 1206 by Ezzard Loreli CROME, RN Outcome: Adequate for Discharge Goal: Will remain free from infection 04/14/2024 1738 by Ezzard Loreli CROME, RN Outcome: Adequate for Discharge 04/14/2024 1206 by Ezzard Loreli CROME, RN Outcome: Adequate for Discharge Goal: Diagnostic test results will improve 04/14/2024 1738 by Ezzard Loreli CROME, RN Outcome: Adequate for Discharge 04/14/2024 1206 by Ezzard Loreli CROME, RN Outcome: Adequate for Discharge Goal: Respiratory complications will improve Outcome: Adequate for Discharge Goal: Cardiovascular complication will be avoided Outcome: Adequate for Discharge   Problem: Activity: Goal: Risk for activity intolerance will decrease Outcome: Adequate for Discharge   Problem: Nutrition: Goal: Adequate nutrition will be maintained 04/14/2024 1738 by Ezzard Loreli CROME, RN Outcome: Adequate for Discharge 04/14/2024 1206 by Ezzard Loreli CROME, RN Outcome: Progressing   Problem: Coping: Goal: Level of anxiety will decrease Outcome: Adequate for Discharge   Problem: Elimination: Goal: Will not experience complications related to bowel motility Outcome: Adequate for Discharge Goal: Will not experience complications related to urinary retention Outcome: Adequate for Discharge   Problem: Pain Managment: Goal: General experience of comfort will improve and/or be controlled Outcome: Adequate for Discharge    Problem: Safety: Goal: Ability to remain free from injury will improve Outcome: Adequate for Discharge   Problem: Skin Integrity: Goal: Risk for impaired skin integrity will decrease Outcome: Adequate for Discharge   "

## 2024-04-14 NOTE — Progress Notes (Signed)
 OT Cancellation Note  Patient Details Name: Victoria Lyons MRN: [id number] DOB: 06/25/1940   Cancelled Treatment:    Reason Eval/Treat Not Completed: OT screened, no needs identified, will sign off  As per therapy team, patient independent with ADL's. Thank you for this referral.   [facility] OT/L Acute Rehabilitation Department  [phone]    04/14/2024, 10:22 AM

## 2024-04-14 NOTE — Plan of Care (Signed)
  Problem: Education: Goal: Knowledge of General Education information will improve Description: Including pain rating scale, medication(s)/side effects and non-pharmacologic comfort measures Outcome: Progressing   Problem: Clinical Measurements: Goal: Ability to maintain clinical measurements within normal limits will improve Outcome: Progressing   Problem: Clinical Measurements: Goal: Diagnostic test results will improve Outcome: Progressing   

## 2024-04-14 NOTE — Discharge Summary (Incomplete)
 " Physician Discharge Summary   Patient: Victoria Lyons MRN: [id number] DOB: 02/25/1941  Admit date:     04/08/2024  Discharge date: 04/14/2024  Discharge Physician: Alejandro Marker, DO   PCP: Douglass Gerard VEAR, PA-C   Recommendations at discharge:    ***  Discharge Diagnoses: Principal Problem:   Acute renal failure superimposed on stage 3a chronic kidney disease (HCC) Active Problems:   Vomiting and diarrhea   Hypertension   AKI (acute kidney injury)  Resolved Problems:   * No resolved hospital problems. [facility] Course: The patient is an 83 year old with history of hypertension, glaucoma and vertigo presented with about 6 days of nausea, poor appetite, ongoing watery diarrhea. Felt dehydrated. In the emergency room blood pressure stable. Afebrile. On room air. BUN 32, creatinine 2 with recently normal creatinine. Admitted due to AKI, significant dehydration.  Her nausea vomiting is improving however continues to have some persistent diarrhea.  Likely this was in the setting of infective viral gastroenteritis.  Assessment and Plan:  Persistent Diarrhea and Nausea/ Vomiting: Likely Viral infective gastroenteritis.   -CT Abd/Pelvis done and showed Diffuse fluid-filled nondilated small bowel and colon, which was suggestive of enteritis or ileus. Additionally there was No evidence for bowel obstruction or bowel wall thickening but  there was Upper normal size of appendix that is diffusely fluid-filled, but no definitive periappendiceal stranding to suggest active inflammation. Notably there was also Aortic atherosclerosis. -Significantly dehydrated. Nausea and Vomiting improved but still having quite a bit of diarrhea -Encourage oral intake and liquid intake -Resume and Continue maintenance IV fluids today as she is high risk of dehydration and decompensation. -Intake output monitoring -C. difficile negative.  GI pathogen panel negative.  -WBC Trend:  Recent Labs  Lab 04/08/24 1716  04/09/24 0622 04/12/24 1016 04/13/24 0603 04/14/24 0609  WBC 8.0 7.2 11.2* 9.3 10.9*  -Supportive Care and C/w IVF and Loperamide  2 mg po q4hprn Diarrhea or Loose Stools.  -Antiemetics w/ po/IV Ondansetron  q6hprn Nausea and IV Prochlorperazine  5 mg q6hprn Refractory N/V   Hypokalemia: K+ Trend went from 3.3 -> 3.1 -> 3.3 -> 3.1 -> 3.5. Replete w/ po KCL 40 mEQ x1 and po K Phos  Neutral 500 mg po BID. CTM and Replete as Necessary. Repeat CMP in the AM   Hypophosphatemia: Improved. Phos Level went from 1.7 -> 2.0 -> 2.9 -> 2.3. Replete w/ po K Phos  Neutral 500 mg po BID x2. CTM and Trend and repeat Phos Level in the AM  Leukocytosis: Likely 2/2 to above. WBC went from 7.2 -> 11.2. CTM and Trend and repeat CBC in the AM    AKI on CKD stage 3b: Improved. Baseline Cr used to be to be ~0.9-1.1 however this was earlier this year. May have had progression in Renal Disease. Due to above. BUN/Cr Trend: Recent Labs  Lab 04/08/24 1716 04/09/24 0622 04/10/24 0450 04/11/24 0559 04/12/24 1016 04/13/24 0603 04/14/24 0609  BUN 32* 30* 25* 18 22 23 19   CREATININE 2.00* 1.47* 1.36* 1.34* 1.48* 1.71* 1.22*  -IVF restarted w/ LR @ 100 mL/hr x 1 day and now will change to 75 mL/hr x1 Day -Check Renal U/S and UA given slight worsening in Renal Fxn. U/A only showed hazy appearance with large leukocytes, negative nitrites, rare bacteria, 0-5 squamous epithelial cells, 0-5 RBCs per high-power field, and 11-20 WBCs -Renal ultrasound done showed Bilateral echogenic kidneys which likely represents sequelae associated with medical renal disease as well as a Small simple right renal cyst. -  Avoid Nephrotoxic Medications, Contrast Dyes, Hypotension and Dehydration to Ensure Adequate Renal Perfusion and will need to Renally Adjust Meds. CTM & Trend Renal Function carefully & repeat CMP in the AM   Normocytic Anemia: Hgb/Hct Trend:  Recent Labs  Lab 04/08/24 1716 04/09/24 0622 04/12/24 1016 04/13/24 0603  04/14/24 0609  HGB 13.4 12.0 12.1 11.2* 10.9*  HCT 41.6 37.1 36.5 33.7* 32.7*  MCV 83.7 84.3 81.7 81.6 81.5  -Likely dilutional Drop from IVF. CTM for S/Sx of Bleeding; No overt bleeding noted. Repeat CBC in the AM  Metabolic Acidosis: CO2 is now 20, AG is 12, and Chloride Level is 105. IVF as above. C/w Sodium Bicarbonate  650 mg po TID x 3 Days. CTM and Trend and repeat CMP in the AM.    Essential Hypertension: Blood pressure stable.  Holding antihypertensives to avoid hypotension due to her continued Diarrhea. CTM BP per Protocol. Last BP reading was 125/62  Hypoalbuminemia: Patient's Albumin Lvl went from 4.7 -> 3.7 -> 3.3. CTM & Trend & repeat CMP in the AM   Assessment and Plan: No notes have been filed under this hospital service. Service: Hospitalist     {Tip this will not be part of the note when signed Body mass index is 26.08 kg/m. , ,  (Optional):26781}  {(NOTE) Pain control PDMP Statment (Optional):26782} Consultants: *** Procedures performed: ***  Disposition: {Plan; Disposition:26390} Diet recommendation:  Discharge Diet Orders (From admission, onward)     Start     Ordered   04/14/24 0000  Diet general        04/14/24 1540           {Diet_Plan:26776} DISCHARGE MEDICATION: Allergies as of 04/14/2024       Reactions   Augmentin [amoxicillin-pot Clavulanate] Palpitations   B/p increase    Ciprofloxacin Rash   Joint pain also   Demeclocycline Other (See Comments)   Unsure of exact reaction type   Latex Rash   Tetanus Toxoid-containing Vaccines Rash   Tetracyclines & Related Rash        Medication List     PAUSE taking these medications    amLODipine  5 MG tablet Wait to take this until your doctor or other care provider tells you to start again. Commonly known as: NORVASC  Take 5 mg by mouth daily.       TAKE these medications    acetaminophen  325 MG tablet Commonly known as: TYLENOL  Take 2 tablets (650 mg total) by mouth every 6  (six) hours as needed for mild pain (pain score 1-3) or fever (or Fever >/= 101).   carboxymethylcellulose 1 % ophthalmic solution Place 1 drop into the left eye 3 (three) times daily as needed (Dry left eye).   fluticasone  50 MCG/ACT nasal spray Commonly known as: FLONASE  Place 1 spray into both nostrils daily as needed for allergies.   loperamide  2 MG capsule Commonly known as: IMODIUM  Take 1 capsule (2 mg total) by mouth every 4 (four) hours as needed for diarrhea or loose stools.   ondansetron  4 MG tablet Commonly known as: ZOFRAN  Take 1 tablet (4 mg total) by mouth every 6 (six) hours as needed for nausea.   oxyCODONE  5 MG immediate release tablet Commonly known as: Oxy IR/ROXICODONE  Take 0.5-1 tablets (2.5-5 mg total) by mouth every 4 (four) hours as needed for moderate pain (pain score 4-6) or severe pain (pain score 7-10).   simethicone  80 MG chewable tablet Commonly known as: MYLICON Chew 1 tablet (80 mg total) by  mouth every 6 (six) hours as needed for flatulence.   sodium bicarbonate  650 MG tablet Take 1 tablet (650 mg total) by mouth 3 (three) times daily for 3 days.        Follow-up Information     Douglass Sayres H, PA-C. Call.   Specialty: Physician Assistant Why: Follow up within 1-2 weeks Contact information: [address] [place] [address] [phone]         Ladora Gunnar DEL., MD Follow up.   Specialty: Gastroenterology Contact information: [address] [place] [address] [phone]                Discharge Exam: Fredricka Weights   04/08/24 1636 04/08/24 2312  Weight: 69.4 kg 73.3 kg   ***  Condition at discharge: {DC Condition:26389}  The results of significant diagnostics from this hospitalization (including imaging, microbiology, ancillary and laboratory) are listed below for reference.   Imaging Studies: US  RENAL Result Date: 04/13/2024 CLINICAL DATA:  Acute kidney injury. EXAM: RENAL / URINARY  TRACT ULTRASOUND COMPLETE COMPARISON:  None Available. FINDINGS: Right Kidney: Renal measurements: 10.5 cm x 3.9 cm x 4.7 cm = volume: A 9.3 mL. There is diffusely increased echogenicity of the renal parenchyma. A 1.6 cm x 1.2 cm x 1.2 cm simple cyst is seen within the right kidney. No hydronephrosis is visualized. Left Kidney: Renal measurements: 10.9 cm x 4.4 cm x 4.3 cm = volume: 106.7 mL. There is diffusely increased echogenicity of the renal parenchyma. No mass or hydronephrosis visualized. Bladder: Appears normal for degree of bladder distention. The ureteral jets are not visualized. Other: None. IMPRESSION: 1. Bilateral echogenic kidneys which likely represents sequelae associated with medical renal disease. 2. Small simple right renal cyst. Electronically Signed   By: Suzen Dials M.D.   On: 04/13/2024 14:15   CT ABDOMEN PELVIS WO CONTRAST Result Date: 04/08/2024 CLINICAL DATA:  Diarrhea EXAM: CT ABDOMEN AND PELVIS WITHOUT CONTRAST TECHNIQUE: Multidetector CT imaging of the abdomen and pelvis was performed following the standard protocol without IV contrast. RADIATION DOSE REDUCTION: This exam was performed according to the departmental dose-optimization program which includes automated exposure control, adjustment of the mA and/or kV according to patient size and/or use of iterative reconstruction technique. COMPARISON:  CT 07/23/2020 FINDINGS: Lower chest: Lung bases demonstrate no acute airspace disease. Bandlike atelectasis or scarring at the lingula, right middle lobe and lung bases. Hepatobiliary: No focal liver abnormality is seen. No gallstones, gallbladder wall thickening, or biliary dilatation. Pancreas: Unremarkable. No pancreatic ductal dilatation or surrounding inflammatory changes. Spleen: Normal in size without focal abnormality. Adrenals/Urinary Tract: Adrenal glands are unremarkable. Kidneys are normal, without renal calculi, focal lesion, or hydronephrosis. Bladder is unremarkable.  Stomach/Bowel: Stomach within normal limits. Diffuse fluid-filled nondilated small bowel. Diffuse fluid within the colon. No acute bowel wall thickening. Upper normal size of appendix measuring up to 7 mm which is also fluid-filled but no convincing Peri appendiceal stranding. Vascular/Lymphatic: Aortic atherosclerosis. No enlarged abdominal or pelvic lymph nodes. Reproductive: Status post hysterectomy. No adnexal masses. Other: No ascites or free air Musculoskeletal: Artifact from right hip arthroplasty. No acute or suspicious osseous abnormality IMPRESSION: 1. Diffuse fluid-filled nondilated small bowel and colon, findings are suggestive of enteritis or ileus. No evidence for bowel obstruction or bowel wall thickening. 2. Upper normal size of appendix that is diffusely fluid-filled, but no definitive periappendiceal stranding to suggest active inflammation 3. Aortic atherosclerosis. Aortic Atherosclerosis (ICD10-I70.0). Electronically Signed   By: Luke  Scott M.D.   On: 04/08/2024 19:10    Microbiology: Results for orders placed or performed during the hospital encounter of 04/08/24  Gastrointestinal Panel by PCR , Stool     Status: None   Collection Time: 04/08/24  5:16 PM   Specimen: Stool  Result Value Ref Range Status   Campylobacter species NOT DETECTED NOT DETECTED Final   Plesimonas shigelloides NOT DETECTED NOT DETECTED Final   Salmonella species NOT DETECTED NOT DETECTED Final   Yersinia enterocolitica NOT DETECTED NOT DETECTED Final   Vibrio species NOT DETECTED NOT DETECTED Final   Vibrio cholerae NOT DETECTED NOT DETECTED Final   Enteroaggregative E coli (EAEC) NOT DETECTED NOT DETECTED Final   Enteropathogenic E coli (EPEC) NOT DETECTED NOT DETECTED Final   Enterotoxigenic E coli (ETEC) NOT DETECTED NOT DETECTED Final   Shiga like toxin producing E coli (STEC) NOT DETECTED NOT DETECTED Final   Shigella/Enteroinvasive E coli (EIEC) NOT DETECTED NOT DETECTED Final   Cryptosporidium  NOT DETECTED NOT DETECTED Final   Cyclospora cayetanensis NOT DETECTED NOT DETECTED Final   Entamoeba histolytica NOT DETECTED NOT DETECTED Final   Giardia lamblia NOT DETECTED NOT DETECTED Final   Adenovirus F40/41 NOT DETECTED NOT DETECTED Final   Astrovirus NOT DETECTED NOT DETECTED Final   Norovirus GI/GII NOT DETECTED NOT DETECTED Final   Rotavirus A NOT DETECTED NOT DETECTED Final   Sapovirus (I, II, IV, and V) NOT DETECTED NOT DETECTED Final    Comment: Performed at [facility], [address] [place], [place] [address]  C Difficile Quick Screen w PCR reflex     Status: None   Collection Time: 04/08/24  5:16 PM   Specimen: Stool  Result Value Ref Range Status   C Diff antigen NEGATIVE NEGATIVE Final   C Diff toxin NEGATIVE NEGATIVE Final   C Diff interpretation No C. difficile detected.  Final    Comment: Performed at [facility], [address], [place], [place] [address]    Labs: CBC: Recent Labs  Lab 04/08/24 1716 04/09/24 0622 04/12/24 1016 04/13/24 0603 04/14/24 0609  WBC 8.0 7.2 11.2* 9.3 10.9*  NEUTROABS 5.7  --  6.3 5.0 6.3  HGB 13.4 12.0 12.1 11.2* 10.9*  HCT 41.6 37.1 36.5 33.7* 32.7*  MCV 83.7 84.3 81.7 81.6 81.5  PLT 281 246 272 247 237   Basic Metabolic Panel: Recent Labs  Lab 04/10/24 0450 04/11/24 0559 04/12/24 1016 04/13/24 0603 04/14/24 0609  NA 138 138 139 137 137  K 3.1* 3.3* 3.1* 3.5 3.4*  CL 108 106 108 105 105  CO2 19* 20* 17* 20* 20*  GLUCOSE 94 104* 106* 110* 104*  BUN 25* 18 22 23 19   CREATININE 1.36* 1.34* 1.48* 1.71* 1.22*  CALCIUM 9.0 9.0 9.6 9.2 8.8*  MG 1.8 1.8 1.8 2.6* 2.2  PHOS 1.7* 2.0* 2.9 2.3* 2.8   Liver Function Tests: Recent Labs  Lab 04/08/24 1716 04/12/24 1016 04/13/24 0603 04/14/24 0609  AST 18 16 12* 12*  ALT 8 7 6 6   ALKPHOS 95 92 86 106  BILITOT 0.6 0.4 0.3 0.3  PROT 8.0 7.3 6.6 6.2*  ALBUMIN 4.7 3.7 3.3* 3.1*   CBG: Recent Labs  Lab 04/09/24 0753  GLUCAP 97    Discharge  time spent: {LESS THAN/GREATER THAN:26388} 30 minutes.  Signed: Alejandro Lazarus Marker, DO [facility] 04/14/2024 "

## 2024-04-14 NOTE — Plan of Care (Signed)
   Problem: Nutrition: Goal: Adequate nutrition will be maintained Outcome: Progressing

## 2024-04-14 NOTE — Evaluation (Signed)
 Physical Therapy Evaluation Patient Details Name: Victoria Lyons MRN: [id number] DOB: [birth year] Today's Date: 04/14/2024  History of Present Illness  83 year old presented with about 6 days of nausea, poor appetite, ongoing watery diarrhea. Felt dehydrated. In the emergency room blood pressure stable. Afebrile. On room air. BUN 32, creatinine 2 with recently normal creatinine. Admitted due to AKI, significant dehydration. Pt with history of hypertension, glaucoma and vertigo  Clinical Impression  Pt is mobilizing well at an independent level. She ambulated 130' without an assistive device, no loss of balance. No further PT indicated, PT signing off, mobility team to follow.         If plan is discharge home, recommend the following:     Can travel by private vehicle        Equipment Recommendations None recommended by PT  Recommendations for Other Services       Functional Status Assessment Patient has not had a recent decline in their functional status     Precautions / Restrictions Precautions Precautions: None Restrictions Weight Bearing Restrictions Per Provider Order: No      Mobility  Bed Mobility Overal bed mobility: Modified Independent             General bed mobility comments: HOB up ~35*    Transfers Overall transfer level: Independent Equipment used: None                    Ambulation/Gait Ambulation/Gait assistance: Independent Gait Distance (Feet): 130 Feet Assistive device: None Gait Pattern/deviations: WFL(Within Functional Limits) Gait velocity: WFL     General Gait Details: steady, no loss of balance, 9/10 lower abdominal pain at rest and no change with walking  Stairs            Wheelchair Mobility     Tilt Bed    Modified Rankin (Stroke Patients Only)       Balance Overall balance assessment: Independent                                           Pertinent Vitals/Pain Pain  Assessment Pain Assessment: 0-10 Pain Score: 9  Pain Location: lower abdomen Pain Descriptors / Indicators: Guarding, Grimacing Pain Intervention(s): Limited activity within patient's tolerance, Monitored during session, Patient requesting pain meds-RN notified, Repositioned    Home Living Family/patient expects to be discharged to:: Private residence Living Arrangements: Alone Available Help at Discharge: Family;Friend(s);Available 24 hours/day Type of Home: House Home Access: Level entry       Home Layout: One level Home Equipment: None Additional Comments: reports she has lots of friends and family that can assist if needed    Prior Function Prior Level of Function : Independent/Modified Independent;Driving             Mobility Comments: denies falls in past 6 months, walks without AD, drives ADLs Comments: independent     Extremity/Trunk Assessment   Upper Extremity Assessment Upper Extremity Assessment: Overall WFL for tasks assessed    Lower Extremity Assessment Lower Extremity Assessment: Overall WFL for tasks assessed    Cervical / Trunk Assessment Cervical / Trunk Assessment: Normal  Communication   Communication Communication: No apparent difficulties    Cognition Arousal: Alert Behavior During Therapy: WFL for tasks assessed/performed   PT - Cognitive impairments: No apparent impairments  Following commands: Intact       Cueing Cueing Techniques: Verbal cues     General Comments      Exercises     Assessment/Plan    PT Assessment Patient does not need any further PT services  PT Problem List         PT Treatment Interventions      PT Goals (Current goals can be found in the Care Plan section)  Acute Rehab PT Goals PT Goal Formulation: All assessment and education complete, DC therapy    Frequency       Co-evaluation               AM-PAC PT 6 Clicks Mobility  Outcome Measure Help  needed turning from your back to your side while in a flat bed without using bedrails?: None Help needed moving from lying on your back to sitting on the side of a flat bed without using bedrails?: None Help needed moving to and from a bed to a chair (including a wheelchair)?: None Help needed standing up from a chair using your arms (e.g., wheelchair or bedside chair)?: None Help needed to walk in hospital room?: None Help needed climbing 3-5 steps with a railing? : None 6 Click Score: 24    End of Session Equipment Utilized During Treatment: Gait belt Activity Tolerance: Patient tolerated treatment well Patient left: in chair;with call bell/phone within reach;with nursing/sitter in room Nurse Communication: Mobility status      Time: 9045-8996 PT Time Calculation (min) (ACUTE ONLY): 9 min   Charges:   PT Evaluation $PT Eval Low Complexity: 1 Low   PT General Charges $$ ACUTE PT VISIT: 1 Visit         Sylvan Delon Copp PT 04/14/2024  Acute Rehabilitation Services  Office [phone]

## 2024-04-14 NOTE — Progress Notes (Signed)
 Discharge instructions communicated to patient, all questions answered, verbalized understanding. Copy of instructions with filled prescriptions received at bedside.  All personal belongings are packed and remain at bedside for discharge.  Awaiting Her Son to arrive for pickup/discharge.
